# Patient Record
Sex: Female | Born: 1970 | Race: White | Hispanic: No | Smoking: Former smoker
Health system: Southern US, Community
[De-identification: ages and names within clinical notes are randomized; demographics above are authoritative.]

## PROBLEM LIST (undated history)

## (undated) DIAGNOSIS — F419 Anxiety disorder, unspecified: Secondary | ICD-10-CM

## (undated) DIAGNOSIS — F909 Attention-deficit hyperactivity disorder, unspecified type: Secondary | ICD-10-CM

## (undated) DIAGNOSIS — F410 Panic disorder [episodic paroxysmal anxiety] without agoraphobia: Secondary | ICD-10-CM

## (undated) HISTORY — PX: FOOT SURGERY: SHX648

## (undated) HISTORY — DX: Attention-deficit hyperactivity disorder, unspecified type: F90.9

## (undated) HISTORY — DX: Panic disorder (episodic paroxysmal anxiety): F41.0

## (undated) HISTORY — PX: FEMUR FRACTURE SURGERY: SHX633

## (undated) HISTORY — PX: HUMERUS FRACTURE SURGERY: SHX670

## (undated) HISTORY — PX: BREAST SURGERY: SHX581

---

## 1999-07-21 ENCOUNTER — Other Ambulatory Visit: Admission: RE | Admit: 1999-07-21 | Discharge: 1999-07-21 | Payer: Self-pay | Admitting: Obstetrics and Gynecology

## 2000-06-13 ENCOUNTER — Ambulatory Visit (HOSPITAL_BASED_OUTPATIENT_CLINIC_OR_DEPARTMENT_OTHER): Admission: RE | Admit: 2000-06-13 | Discharge: 2000-06-13 | Payer: Self-pay | Admitting: Otolaryngology

## 2001-03-21 ENCOUNTER — Ambulatory Visit (HOSPITAL_BASED_OUTPATIENT_CLINIC_OR_DEPARTMENT_OTHER): Admission: RE | Admit: 2001-03-21 | Discharge: 2001-03-22 | Payer: Self-pay | Admitting: Orthopedic Surgery

## 2001-04-02 ENCOUNTER — Encounter: Admission: RE | Admit: 2001-04-02 | Discharge: 2001-07-01 | Payer: Self-pay | Admitting: Orthopedic Surgery

## 2001-07-02 ENCOUNTER — Encounter: Admission: RE | Admit: 2001-07-02 | Discharge: 2001-08-05 | Payer: Self-pay | Admitting: Orthopedic Surgery

## 2002-01-07 ENCOUNTER — Encounter: Admission: RE | Admit: 2002-01-07 | Discharge: 2002-02-03 | Payer: Self-pay | Admitting: Pain Medicine

## 2002-02-20 ENCOUNTER — Ambulatory Visit (HOSPITAL_COMMUNITY): Admission: RE | Admit: 2002-02-20 | Discharge: 2002-02-20 | Payer: Self-pay | Admitting: Otolaryngology

## 2002-02-24 ENCOUNTER — Encounter: Admission: RE | Admit: 2002-02-24 | Discharge: 2002-04-15 | Payer: Self-pay | Admitting: Orthopedic Surgery

## 2002-04-26 ENCOUNTER — Emergency Department (HOSPITAL_COMMUNITY): Admission: EM | Admit: 2002-04-26 | Discharge: 2002-04-26 | Payer: Self-pay | Admitting: Emergency Medicine

## 2002-04-26 ENCOUNTER — Encounter: Payer: Self-pay | Admitting: Emergency Medicine

## 2003-02-08 ENCOUNTER — Emergency Department (HOSPITAL_COMMUNITY): Admission: EM | Admit: 2003-02-08 | Discharge: 2003-02-08 | Payer: Self-pay | Admitting: *Deleted

## 2004-04-06 ENCOUNTER — Emergency Department (HOSPITAL_COMMUNITY): Admission: EM | Admit: 2004-04-06 | Discharge: 2004-04-06 | Payer: Self-pay | Admitting: Family Medicine

## 2004-06-18 ENCOUNTER — Ambulatory Visit: Payer: Self-pay | Admitting: Psychiatry

## 2004-06-18 ENCOUNTER — Inpatient Hospital Stay (HOSPITAL_COMMUNITY): Admission: RE | Admit: 2004-06-18 | Discharge: 2004-06-22 | Payer: Self-pay | Admitting: Psychiatry

## 2004-08-18 ENCOUNTER — Emergency Department (HOSPITAL_COMMUNITY): Admission: EM | Admit: 2004-08-18 | Discharge: 2004-08-19 | Payer: Self-pay | Admitting: Emergency Medicine

## 2004-09-20 ENCOUNTER — Emergency Department (HOSPITAL_COMMUNITY): Admission: EM | Admit: 2004-09-20 | Discharge: 2004-09-20 | Payer: Self-pay | Admitting: Emergency Medicine

## 2004-10-26 ENCOUNTER — Emergency Department (HOSPITAL_COMMUNITY): Admission: EM | Admit: 2004-10-26 | Discharge: 2004-10-26 | Payer: Self-pay | Admitting: Emergency Medicine

## 2004-11-06 ENCOUNTER — Emergency Department (HOSPITAL_COMMUNITY): Admission: EM | Admit: 2004-11-06 | Discharge: 2004-11-06 | Payer: Self-pay | Admitting: Emergency Medicine

## 2004-11-08 ENCOUNTER — Emergency Department (HOSPITAL_COMMUNITY): Admission: EM | Admit: 2004-11-08 | Discharge: 2004-11-08 | Payer: Self-pay | Admitting: Family Medicine

## 2004-11-11 ENCOUNTER — Emergency Department (HOSPITAL_COMMUNITY): Admission: AD | Admit: 2004-11-11 | Discharge: 2004-11-11 | Payer: Self-pay | Admitting: Family Medicine

## 2004-11-16 ENCOUNTER — Emergency Department (HOSPITAL_COMMUNITY): Admission: EM | Admit: 2004-11-16 | Discharge: 2004-11-16 | Payer: Self-pay | Admitting: Emergency Medicine

## 2004-11-27 ENCOUNTER — Ambulatory Visit: Payer: Self-pay | Admitting: Internal Medicine

## 2004-11-30 ENCOUNTER — Ambulatory Visit: Payer: Self-pay | Admitting: Internal Medicine

## 2004-12-07 ENCOUNTER — Emergency Department (HOSPITAL_COMMUNITY): Admission: EM | Admit: 2004-12-07 | Discharge: 2004-12-07 | Payer: Self-pay | Admitting: Emergency Medicine

## 2005-03-03 ENCOUNTER — Emergency Department (HOSPITAL_COMMUNITY): Admission: EM | Admit: 2005-03-03 | Discharge: 2005-03-03 | Payer: Self-pay | Admitting: Emergency Medicine

## 2006-02-07 ENCOUNTER — Emergency Department (HOSPITAL_COMMUNITY): Admission: EM | Admit: 2006-02-07 | Discharge: 2006-02-07 | Payer: Self-pay | Admitting: Emergency Medicine

## 2006-02-18 ENCOUNTER — Emergency Department (HOSPITAL_COMMUNITY): Admission: EM | Admit: 2006-02-18 | Discharge: 2006-02-18 | Payer: Self-pay | Admitting: Emergency Medicine

## 2006-02-27 ENCOUNTER — Emergency Department (HOSPITAL_COMMUNITY): Admission: EM | Admit: 2006-02-27 | Discharge: 2006-02-27 | Payer: Self-pay | Admitting: Emergency Medicine

## 2007-06-08 ENCOUNTER — Emergency Department (HOSPITAL_COMMUNITY): Admission: EM | Admit: 2007-06-08 | Discharge: 2007-06-08 | Payer: Self-pay | Admitting: Emergency Medicine

## 2007-09-28 ENCOUNTER — Emergency Department (HOSPITAL_BASED_OUTPATIENT_CLINIC_OR_DEPARTMENT_OTHER): Admission: EM | Admit: 2007-09-28 | Discharge: 2007-09-28 | Payer: Self-pay | Admitting: Emergency Medicine

## 2007-10-15 ENCOUNTER — Inpatient Hospital Stay (HOSPITAL_COMMUNITY): Admission: EM | Admit: 2007-10-15 | Discharge: 2007-11-18 | Payer: Self-pay | Admitting: *Deleted

## 2007-11-18 ENCOUNTER — Encounter (INDEPENDENT_AMBULATORY_CARE_PROVIDER_SITE_OTHER): Payer: Self-pay | Admitting: Orthopaedic Surgery

## 2007-11-18 ENCOUNTER — Ambulatory Visit: Payer: Self-pay | Admitting: Surgery

## 2007-12-12 ENCOUNTER — Ambulatory Visit (HOSPITAL_COMMUNITY): Admission: RE | Admit: 2007-12-12 | Discharge: 2007-12-12 | Payer: Self-pay | Admitting: Internal Medicine

## 2007-12-17 ENCOUNTER — Ambulatory Visit (HOSPITAL_COMMUNITY): Admission: RE | Admit: 2007-12-17 | Discharge: 2007-12-18 | Payer: Self-pay | Admitting: Orthopaedic Surgery

## 2008-01-15 ENCOUNTER — Encounter: Admission: RE | Admit: 2008-01-15 | Discharge: 2008-01-15 | Payer: Self-pay | Admitting: Orthopaedic Surgery

## 2008-04-06 ENCOUNTER — Inpatient Hospital Stay (HOSPITAL_COMMUNITY): Admission: AD | Admit: 2008-04-06 | Discharge: 2008-04-07 | Payer: Self-pay

## 2008-04-16 DIAGNOSIS — L02419 Cutaneous abscess of limb, unspecified: Secondary | ICD-10-CM | POA: Insufficient documentation

## 2008-04-16 DIAGNOSIS — L03119 Cellulitis of unspecified part of limb: Secondary | ICD-10-CM

## 2008-04-21 ENCOUNTER — Encounter: Admission: RE | Admit: 2008-04-21 | Discharge: 2008-05-13 | Payer: Self-pay | Admitting: Family Medicine

## 2008-04-28 ENCOUNTER — Encounter: Admission: RE | Admit: 2008-04-28 | Discharge: 2008-04-28 | Payer: Self-pay | Admitting: Geriatric Medicine

## 2008-04-28 ENCOUNTER — Encounter: Payer: Self-pay | Admitting: Infectious Disease

## 2008-05-03 ENCOUNTER — Encounter: Payer: Self-pay | Admitting: Infectious Disease

## 2008-05-08 ENCOUNTER — Inpatient Hospital Stay (HOSPITAL_COMMUNITY): Admission: EM | Admit: 2008-05-08 | Discharge: 2008-05-18 | Payer: Self-pay | Admitting: Emergency Medicine

## 2008-05-10 ENCOUNTER — Ambulatory Visit: Payer: Self-pay | Admitting: Infectious Diseases

## 2008-05-12 ENCOUNTER — Encounter (INDEPENDENT_AMBULATORY_CARE_PROVIDER_SITE_OTHER): Payer: Self-pay | Admitting: Orthopaedic Surgery

## 2008-05-28 ENCOUNTER — Encounter: Payer: Self-pay | Admitting: Infectious Disease

## 2008-05-31 ENCOUNTER — Ambulatory Visit: Payer: Self-pay | Admitting: Infectious Disease

## 2008-05-31 DIAGNOSIS — F429 Obsessive-compulsive disorder, unspecified: Secondary | ICD-10-CM | POA: Insufficient documentation

## 2008-05-31 DIAGNOSIS — M86679 Other chronic osteomyelitis, unspecified ankle and foot: Secondary | ICD-10-CM | POA: Insufficient documentation

## 2008-05-31 DIAGNOSIS — F341 Dysthymic disorder: Secondary | ICD-10-CM | POA: Insufficient documentation

## 2008-05-31 DIAGNOSIS — F1910 Other psychoactive substance abuse, uncomplicated: Secondary | ICD-10-CM | POA: Insufficient documentation

## 2008-06-01 LAB — CONVERTED CEMR LAB
Albumin: 4.6 g/dL (ref 3.5–5.2)
Alkaline Phosphatase: 140 units/L — ABNORMAL HIGH (ref 39–117)
BUN: 16 mg/dL (ref 6–23)
Basophils Relative: 1 % (ref 0–1)
Calcium: 9.8 mg/dL (ref 8.4–10.5)
Eosinophils Absolute: 0.3 10*3/uL (ref 0.0–0.7)
Eosinophils Relative: 3 % (ref 0–5)
Glucose, Bld: 89 mg/dL (ref 70–99)
HCT: 32.5 % — ABNORMAL LOW (ref 36.0–46.0)
Lymphs Abs: 3.2 10*3/uL (ref 0.7–4.0)
MCHC: 31.7 g/dL (ref 30.0–36.0)
MCV: 89.3 fL (ref 78.0–100.0)
Platelets: 444 10*3/uL — ABNORMAL HIGH (ref 150–400)
Potassium: 4.8 meq/L (ref 3.5–5.3)
RDW: 14.7 % (ref 11.5–15.5)
WBC: 10 10*3/uL (ref 4.0–10.5)

## 2008-06-25 ENCOUNTER — Telehealth: Payer: Self-pay | Admitting: Infectious Disease

## 2008-06-29 ENCOUNTER — Telehealth: Payer: Self-pay | Admitting: Infectious Disease

## 2008-06-29 ENCOUNTER — Encounter (INDEPENDENT_AMBULATORY_CARE_PROVIDER_SITE_OTHER): Payer: Self-pay | Admitting: Internal Medicine

## 2008-07-01 ENCOUNTER — Telehealth: Payer: Self-pay | Admitting: Infectious Disease

## 2008-07-05 ENCOUNTER — Ambulatory Visit (HOSPITAL_COMMUNITY): Admission: RE | Admit: 2008-07-05 | Discharge: 2008-07-05 | Payer: Self-pay | Admitting: Infectious Disease

## 2008-07-05 ENCOUNTER — Ambulatory Visit: Payer: Self-pay | Admitting: Infectious Disease

## 2008-07-05 ENCOUNTER — Encounter (INDEPENDENT_AMBULATORY_CARE_PROVIDER_SITE_OTHER): Payer: Self-pay | Admitting: *Deleted

## 2008-07-05 DIAGNOSIS — R0789 Other chest pain: Secondary | ICD-10-CM | POA: Insufficient documentation

## 2008-07-05 LAB — CONVERTED CEMR LAB
CRP: 0.4 mg/dL (ref <0.6)
GFR calc Af Amer: >60 mL/min (ref >60)
GFR calc non Af Amer: >60 mL/min (ref >60)
Glucose, Bld: 105 mg/dL — ABNORMAL HIGH (ref 70–99)
Hemoglobin: 11.7 g/dL — ABNORMAL LOW (ref 12.0–15.0)
Lymphocytes Relative: 31 % (ref 12–46)
Monocytes Absolute: 0.5 10*3/uL (ref 0.1–1.0)
Monocytes Relative: 7 % (ref 3–12)
Neutro Abs: 4.1 10*3/uL (ref 1.7–7.7)
Potassium: 4.3 meq/L (ref 3.5–5.3)
RBC: 4.38 M/uL (ref 3.87–5.11)
Sed Rate: 31 mm/hr — ABNORMAL HIGH (ref 0–22)
Sodium: 135 meq/L (ref 135–145)

## 2008-07-07 ENCOUNTER — Ambulatory Visit: Payer: Self-pay | Admitting: Vascular Surgery

## 2008-07-07 ENCOUNTER — Encounter: Payer: Self-pay | Admitting: Infectious Disease

## 2008-07-07 ENCOUNTER — Ambulatory Visit (HOSPITAL_COMMUNITY): Admission: RE | Admit: 2008-07-07 | Discharge: 2008-07-07 | Payer: Self-pay | Admitting: Infectious Disease

## 2008-07-15 ENCOUNTER — Ambulatory Visit: Payer: Self-pay | Admitting: Infectious Disease

## 2008-07-15 DIAGNOSIS — R42 Dizziness and giddiness: Secondary | ICD-10-CM | POA: Insufficient documentation

## 2008-07-15 DIAGNOSIS — Z9189 Other specified personal risk factors, not elsewhere classified: Secondary | ICD-10-CM | POA: Insufficient documentation

## 2008-07-15 DIAGNOSIS — R0602 Shortness of breath: Secondary | ICD-10-CM | POA: Insufficient documentation

## 2008-07-15 LAB — CONVERTED CEMR LAB
ALT: 30 units/L (ref 0–35)
AST: 35 units/L (ref 0–37)
Albumin: 4.2 g/dL (ref 3.5–5.2)
Basophils Relative: 1 % (ref 0–1)
Benzodiazepines.: POSITIVE — AB
Calcium: 9.8 mg/dL (ref 8.4–10.5)
Chloride: 97 meq/L (ref 96–112)
Cocaine Metabolites: NEGATIVE
Creatinine,U: 168.7 mg/dL
Eosinophils Absolute: 0.4 10*3/uL (ref 0.0–0.7)
Eosinophils Relative: 6 % — ABNORMAL HIGH (ref 0–5)
Leukocytes, UA: NEGATIVE
MCHC: 33.6 g/dL (ref 30.0–36.0)
MCV: 80.4 fL (ref 78.0–100.0)
Monocytes Relative: 6 % (ref 3–12)
Neutrophils Relative %: 46 % (ref 43–77)
Nitrite: NEGATIVE
Phencyclidine (PCP): NEGATIVE
Potassium: 4.4 meq/L (ref 3.5–5.3)
Pro B Natriuretic peptide (BNP): <30 pg/mL (ref 0.0–100.0)
Propoxyphene: NEGATIVE
RBC: 4.45 M/uL (ref 3.87–5.11)
Specific Gravity, Urine: 1.022 (ref 1.005–1.030)
pH: 8 (ref 5.0–8.0)

## 2008-07-27 ENCOUNTER — Encounter: Payer: Self-pay | Admitting: Infectious Disease

## 2008-08-11 ENCOUNTER — Encounter (INDEPENDENT_AMBULATORY_CARE_PROVIDER_SITE_OTHER): Payer: Self-pay | Admitting: Orthopaedic Surgery

## 2008-08-11 ENCOUNTER — Ambulatory Visit (HOSPITAL_COMMUNITY): Admission: RE | Admit: 2008-08-11 | Discharge: 2008-08-11 | Payer: Self-pay | Admitting: Orthopaedic Surgery

## 2008-08-16 ENCOUNTER — Encounter (INDEPENDENT_AMBULATORY_CARE_PROVIDER_SITE_OTHER): Payer: Self-pay | Admitting: Orthopaedic Surgery

## 2008-09-24 ENCOUNTER — Inpatient Hospital Stay (HOSPITAL_COMMUNITY): Admission: RE | Admit: 2008-09-24 | Discharge: 2008-09-28 | Payer: Self-pay | Admitting: Orthopedic Surgery

## 2008-10-11 ENCOUNTER — Encounter: Payer: Self-pay | Admitting: Infectious Disease

## 2008-10-21 ENCOUNTER — Ambulatory Visit: Payer: Self-pay | Admitting: Obstetrics and Gynecology

## 2008-10-22 ENCOUNTER — Encounter: Payer: Self-pay | Admitting: Family

## 2008-10-22 LAB — CONVERTED CEMR LAB
Estradiol: 110.3 pg/mL
FSH: 2.1 milliintl units/mL
LH: 1.9 milliintl units/mL
TSH: 0.819 microintl units/mL (ref 0.350–4.500)

## 2008-10-26 ENCOUNTER — Ambulatory Visit (HOSPITAL_COMMUNITY): Admission: RE | Admit: 2008-10-26 | Discharge: 2008-10-26 | Payer: Self-pay | Admitting: Obstetrics & Gynecology

## 2008-10-28 ENCOUNTER — Encounter
Admission: RE | Admit: 2008-10-28 | Discharge: 2008-10-28 | Payer: Self-pay | Admitting: Physical Medicine & Rehabilitation

## 2008-12-09 ENCOUNTER — Emergency Department (HOSPITAL_COMMUNITY): Admission: EM | Admit: 2008-12-09 | Discharge: 2008-12-10 | Payer: Self-pay | Admitting: Emergency Medicine

## 2009-05-31 ENCOUNTER — Emergency Department (HOSPITAL_COMMUNITY): Admission: EM | Admit: 2009-05-31 | Discharge: 2009-05-31 | Payer: Self-pay | Admitting: Emergency Medicine

## 2009-12-06 ENCOUNTER — Ambulatory Visit: Payer: Self-pay | Admitting: Psychology

## 2010-03-17 ENCOUNTER — Encounter: Payer: Self-pay | Admitting: *Deleted

## 2010-04-01 ENCOUNTER — Encounter: Payer: Self-pay | Admitting: Geriatric Medicine

## 2010-04-04 ENCOUNTER — Encounter: Admit: 2010-04-04 | Payer: Self-pay | Admitting: Family Medicine

## 2010-04-13 NOTE — Miscellaneous (Signed)
Summary: narcotics directives  Clinical Lists Changes       Patient's name and MRN given to ED on 01/27/2009 for designation of "Pain Care Plan" based on physician order noted in Advanced Directive and registration sections of chart. Dorie Rank RN  March 17, 2010 7:40 AM

## 2010-04-20 ENCOUNTER — Emergency Department (HOSPITAL_COMMUNITY): Payer: No Typology Code available for payment source

## 2010-04-20 ENCOUNTER — Emergency Department (HOSPITAL_COMMUNITY)
Admission: EM | Admit: 2010-04-20 | Discharge: 2010-04-21 | Disposition: A | Discharge status: Home / Self Care | Payer: No Typology Code available for payment source | Attending: Emergency Medicine | Admitting: Emergency Medicine

## 2010-04-20 DIAGNOSIS — H538 Other visual disturbances: Secondary | ICD-10-CM | POA: Insufficient documentation

## 2010-04-20 DIAGNOSIS — M542 Cervicalgia: Secondary | ICD-10-CM | POA: Insufficient documentation

## 2010-04-20 DIAGNOSIS — R51 Headache: Secondary | ICD-10-CM | POA: Insufficient documentation

## 2010-04-20 DIAGNOSIS — S0990XA Unspecified injury of head, initial encounter: Secondary | ICD-10-CM | POA: Insufficient documentation

## 2010-04-20 DIAGNOSIS — H5710 Ocular pain, unspecified eye: Secondary | ICD-10-CM | POA: Insufficient documentation

## 2010-04-20 DIAGNOSIS — M25579 Pain in unspecified ankle and joints of unspecified foot: Secondary | ICD-10-CM | POA: Insufficient documentation

## 2010-06-05 LAB — URINALYSIS, ROUTINE W REFLEX MICROSCOPIC
Bilirubin Urine: NEGATIVE
Hgb urine dipstick: NEGATIVE
Ketones, ur: NEGATIVE mg/dL
Nitrite: NEGATIVE
Urobilinogen, UA: 0.2 mg/dL (ref 0.0–1.0)

## 2010-06-05 LAB — COMPREHENSIVE METABOLIC PANEL
ALT: 26 U/L (ref 0–35)
CO2: 30 mEq/L (ref 19–32)
Calcium: 10.2 mg/dL (ref 8.4–10.5)
Creatinine, Ser: 0.8 mg/dL (ref 0.4–1.2)
GFR calc non Af Amer: >60 mL/min (ref >60)
Glucose, Bld: 86 mg/dL (ref 70–99)
Sodium: 138 mEq/L (ref 135–145)
Total Protein: 8 g/dL (ref 6.0–8.3)

## 2010-06-05 LAB — RAPID URINE DRUG SCREEN, HOSP PERFORMED
Amphetamines: NOT DETECTED
Barbiturates: NOT DETECTED
Benzodiazepines: POSITIVE — AB
Opiates: NOT DETECTED

## 2010-06-05 LAB — DIFFERENTIAL
Lymphocytes Relative: 33 % (ref 12–46)
Lymphs Abs: 2.5 10*3/uL (ref 0.7–4.0)
Monocytes Relative: 7 % (ref 3–12)
Neutro Abs: 4.4 10*3/uL (ref 1.7–7.7)
Neutrophils Relative %: 57 % (ref 43–77)

## 2010-06-05 LAB — LIPASE, BLOOD: Lipase: 27 U/L (ref 11–59)

## 2010-06-05 LAB — CBC
Hemoglobin: 13.9 g/dL (ref 12.0–15.0)
MCHC: 32.8 g/dL (ref 30.0–36.0)
MCV: 85.4 fL (ref 78.0–100.0)
RBC: 4.94 MIL/uL (ref 3.87–5.11)
RDW: 15.5 % (ref 11.5–15.5)

## 2010-06-05 LAB — URINE MICROSCOPIC-ADD ON

## 2010-06-18 LAB — PROTIME-INR
INR: 1.1 (ref 0.00–1.49)
INR: 1.7 — ABNORMAL HIGH (ref 0.00–1.49)
INR: 2.7 — ABNORMAL HIGH (ref 0.00–1.49)
INR: 1.1 (ref 0.00–1.49)
Prothrombin Time: 14.3 seconds (ref 11.6–15.2)
Prothrombin Time: 20.5 seconds — ABNORMAL HIGH (ref 11.6–15.2)
Prothrombin Time: 29.6 seconds — ABNORMAL HIGH (ref 11.6–15.2)
Prothrombin Time: 14.1 seconds (ref 11.6–15.2)

## 2010-06-18 LAB — COMPREHENSIVE METABOLIC PANEL
Alkaline Phosphatase: 98 U/L (ref 39–117)
BUN: 5 mg/dL — ABNORMAL LOW (ref 6–23)
Creatinine, Ser: 0.81 mg/dL (ref 0.4–1.2)
Glucose, Bld: 102 mg/dL — ABNORMAL HIGH (ref 70–99)
Potassium: 4.9 mEq/L (ref 3.5–5.1)
Total Protein: 7.4 g/dL (ref 6.0–8.3)

## 2010-06-18 LAB — CBC
HCT: 40.6 % (ref 36.0–46.0)
Hemoglobin: 13.6 g/dL (ref 12.0–15.0)
MCHC: 33.6 g/dL (ref 30.0–36.0)
MCV: 81.1 fL (ref 78.0–100.0)
Platelets: 321 10*3/uL (ref 150–400)
RDW: 18.9 % — ABNORMAL HIGH (ref 11.5–15.5)

## 2010-06-18 LAB — APTT: aPTT: 31 seconds (ref 24–37)

## 2010-06-18 LAB — BILIRUBIN, DIRECT: Bilirubin, Direct: <0.1 mg/dL (ref 0.0–0.3)

## 2010-06-19 LAB — ANAEROBIC CULTURE: Gram Stain: NONE SEEN

## 2010-06-19 LAB — TISSUE CULTURE

## 2010-06-19 LAB — WOUND CULTURE: Culture: NO GROWTH

## 2010-06-20 LAB — BASIC METABOLIC PANEL
BUN: 6 mg/dL (ref 6–23)
Chloride: 101 mEq/L (ref 96–112)
Creatinine, Ser: 0.81 mg/dL (ref 0.4–1.2)
GFR calc non Af Amer: >60 mL/min (ref >60)
Glucose, Bld: 100 mg/dL — ABNORMAL HIGH (ref 70–99)
Potassium: 4.9 mEq/L (ref 3.5–5.1)

## 2010-06-20 LAB — DIFFERENTIAL
Basophils Absolute: 0 10*3/uL (ref 0.0–0.1)
Eosinophils Absolute: 0.3 10*3/uL (ref 0.0–0.7)
Eosinophils Relative: 5 % (ref 0–5)

## 2010-06-20 LAB — CBC
HCT: 34.8 % — ABNORMAL LOW (ref 36.0–46.0)
MCV: 80.2 fL (ref 78.0–100.0)
Platelets: 258 10*3/uL (ref 150–400)
RDW: 16.1 % — ABNORMAL HIGH (ref 11.5–15.5)

## 2010-06-22 LAB — BASIC METABOLIC PANEL
BUN: 9 mg/dL (ref 6–23)
CO2: 30 mEq/L (ref 19–32)
Calcium: 8.9 mg/dL (ref 8.4–10.5)
Creatinine, Ser: 0.68 mg/dL (ref 0.4–1.2)
GFR calc Af Amer: >60 mL/min (ref >60)
Glucose, Bld: 116 mg/dL — ABNORMAL HIGH (ref 70–99)

## 2010-06-22 LAB — TISSUE CULTURE

## 2010-06-22 LAB — CBC
MCHC: 34.1 g/dL (ref 30.0–36.0)
Platelets: 228 10*3/uL (ref 150–400)
Platelets: 241 10*3/uL (ref 150–400)
RBC: 2.67 MIL/uL — ABNORMAL LOW (ref 3.87–5.11)
RDW: 16 % — ABNORMAL HIGH (ref 11.5–15.5)
WBC: 4.9 10*3/uL (ref 4.0–10.5)

## 2010-06-22 LAB — VITAMIN D 1,25 DIHYDROXY
Vitamin D 1, 25 (OH)2 Total: 14 pg/mL — ABNORMAL LOW (ref 18–72)
Vitamin D3 1, 25 (OH)2: 14 pg/mL

## 2010-06-22 LAB — ANAEROBIC CULTURE

## 2010-06-22 LAB — DIFFERENTIAL
Basophils Absolute: 0 10*3/uL (ref 0.0–0.1)
Eosinophils Relative: 8 % — ABNORMAL HIGH (ref 0–5)
Lymphocytes Relative: 38 % (ref 12–46)
Neutro Abs: 2.1 10*3/uL (ref 1.7–7.7)
Neutrophils Relative %: 42 % — ABNORMAL LOW (ref 43–77)

## 2010-06-22 LAB — C-REACTIVE PROTEIN: CRP: 1.4 mg/dL — ABNORMAL HIGH (ref <0.6)

## 2010-06-22 LAB — HEMOGLOBIN AND HEMATOCRIT, BLOOD
HCT: 22 % — ABNORMAL LOW (ref 36.0–46.0)
Hemoglobin: 7.5 g/dL — CL (ref 12.0–15.0)

## 2010-06-22 LAB — SEDIMENTATION RATE: Sed Rate: 30 mm/hr — ABNORMAL HIGH (ref 0–22)

## 2010-06-26 LAB — URINALYSIS, ROUTINE W REFLEX MICROSCOPIC
Bilirubin Urine: NEGATIVE
Hgb urine dipstick: NEGATIVE
Protein, ur: NEGATIVE mg/dL
Urobilinogen, UA: 0.2 mg/dL (ref 0.0–1.0)

## 2010-06-26 LAB — DIFFERENTIAL
Eosinophils Absolute: 0.3 10*3/uL (ref 0.0–0.7)
Eosinophils Relative: 4 % (ref 0–5)
Lymphocytes Relative: 28 % (ref 12–46)
Lymphs Abs: 2 10*3/uL (ref 0.7–4.0)
Monocytes Relative: 9 % (ref 3–12)

## 2010-06-26 LAB — CBC
HCT: 30.7 % — ABNORMAL LOW (ref 36.0–46.0)
Hemoglobin: 10.4 g/dL — ABNORMAL LOW (ref 12.0–15.0)
MCHC: 33.5 g/dL (ref 30.0–36.0)
Platelets: 352 10*3/uL (ref 150–400)
RBC: 3.6 MIL/uL — ABNORMAL LOW (ref 3.87–5.11)
RDW: 19.4 % — ABNORMAL HIGH (ref 11.5–15.5)
WBC: 6.3 10*3/uL (ref 4.0–10.5)

## 2010-06-26 LAB — COMPREHENSIVE METABOLIC PANEL
ALT: 13 U/L (ref 0–35)
AST: 19 U/L (ref 0–37)
Alkaline Phosphatase: 107 U/L (ref 39–117)
BUN: 11 mg/dL (ref 6–23)
CO2: 29 mEq/L (ref 19–32)
Calcium: 9.9 mg/dL (ref 8.4–10.5)
Chloride: 102 mEq/L (ref 96–112)
Creatinine, Ser: 0.97 mg/dL (ref 0.4–1.2)
GFR calc Af Amer: >60 mL/min (ref >60)
Glucose, Bld: 94 mg/dL (ref 70–99)
Potassium: 4.6 mEq/L (ref 3.5–5.1)
Sodium: 138 mEq/L (ref 135–145)
Total Bilirubin: 0.6 mg/dL (ref 0.3–1.2)
Total Protein: 7.3 g/dL (ref 6.0–8.3)

## 2010-06-26 LAB — C-REACTIVE PROTEIN: CRP: 1 mg/dL — ABNORMAL HIGH (ref <0.6)

## 2010-06-26 LAB — PROTIME-INR: INR: 1.1 (ref 0.00–1.49)

## 2010-06-26 LAB — URINE CULTURE

## 2010-06-27 LAB — BASIC METABOLIC PANEL
CO2: 30 mEq/L (ref 19–32)
Chloride: 101 mEq/L (ref 96–112)
Creatinine, Ser: 0.77 mg/dL (ref 0.4–1.2)
GFR calc Af Amer: >60 mL/min (ref >60)
Potassium: 4.4 mEq/L (ref 3.5–5.1)

## 2010-06-27 LAB — CBC
MCV: 88.8 fL (ref 78.0–100.0)
Platelets: 262 10*3/uL (ref 150–400)
RDW: 17.4 % — ABNORMAL HIGH (ref 11.5–15.5)
WBC: 8.1 10*3/uL (ref 4.0–10.5)

## 2010-06-27 LAB — DIFFERENTIAL
Basophils Absolute: 0 10*3/uL (ref 0.0–0.1)
Eosinophils Absolute: 0.4 10*3/uL (ref 0.0–0.7)
Lymphocytes Relative: 34 % (ref 12–46)
Lymphs Abs: 2.8 10*3/uL (ref 0.7–4.0)
Neutrophils Relative %: 52 % (ref 43–77)

## 2010-06-27 LAB — CULTURE, BLOOD (ROUTINE X 2)

## 2010-07-16 IMAGING — CR DG FEMUR 2V*L*
2 series · 2 of 2 positions shown · non-contrast
Comparison: 10/15/2007

CLINICAL DATA: Osteomyelitis

LEFT FEMUR - 2 VIEW

[view not recorded (1 of 2)]
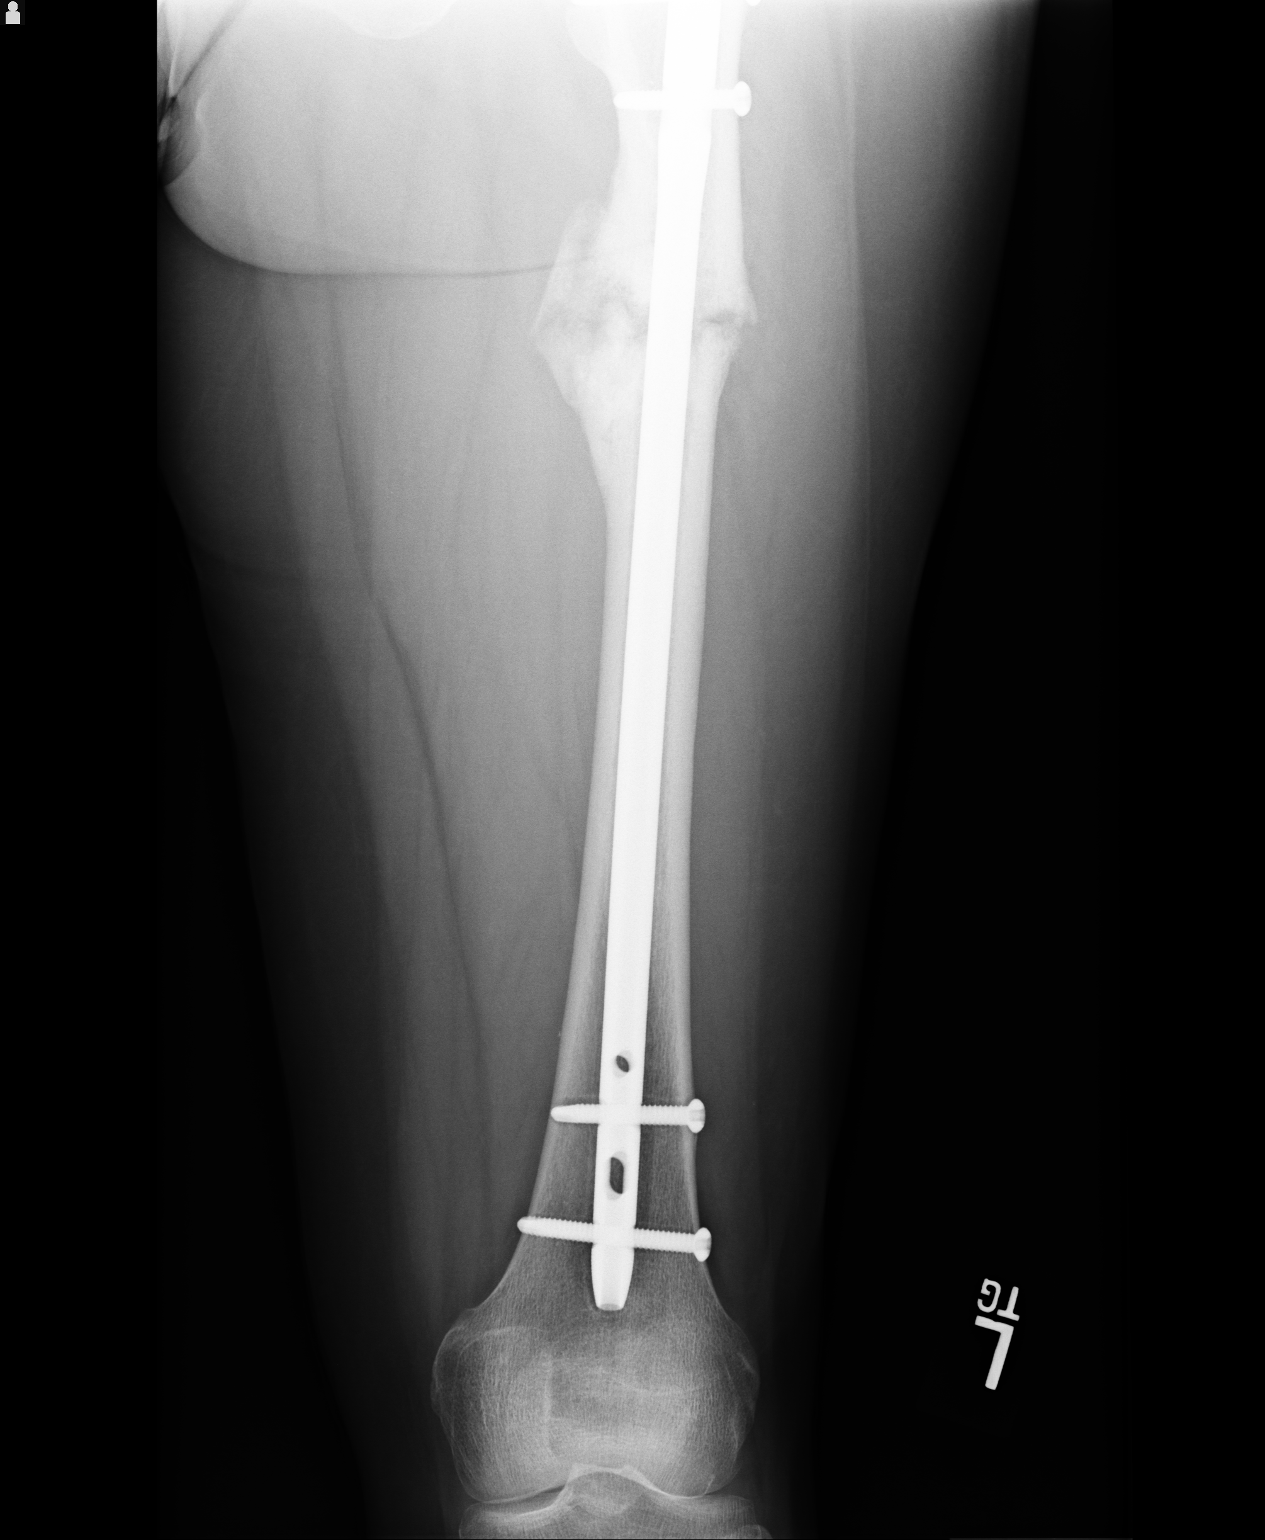

[view not recorded (2 of 2)]
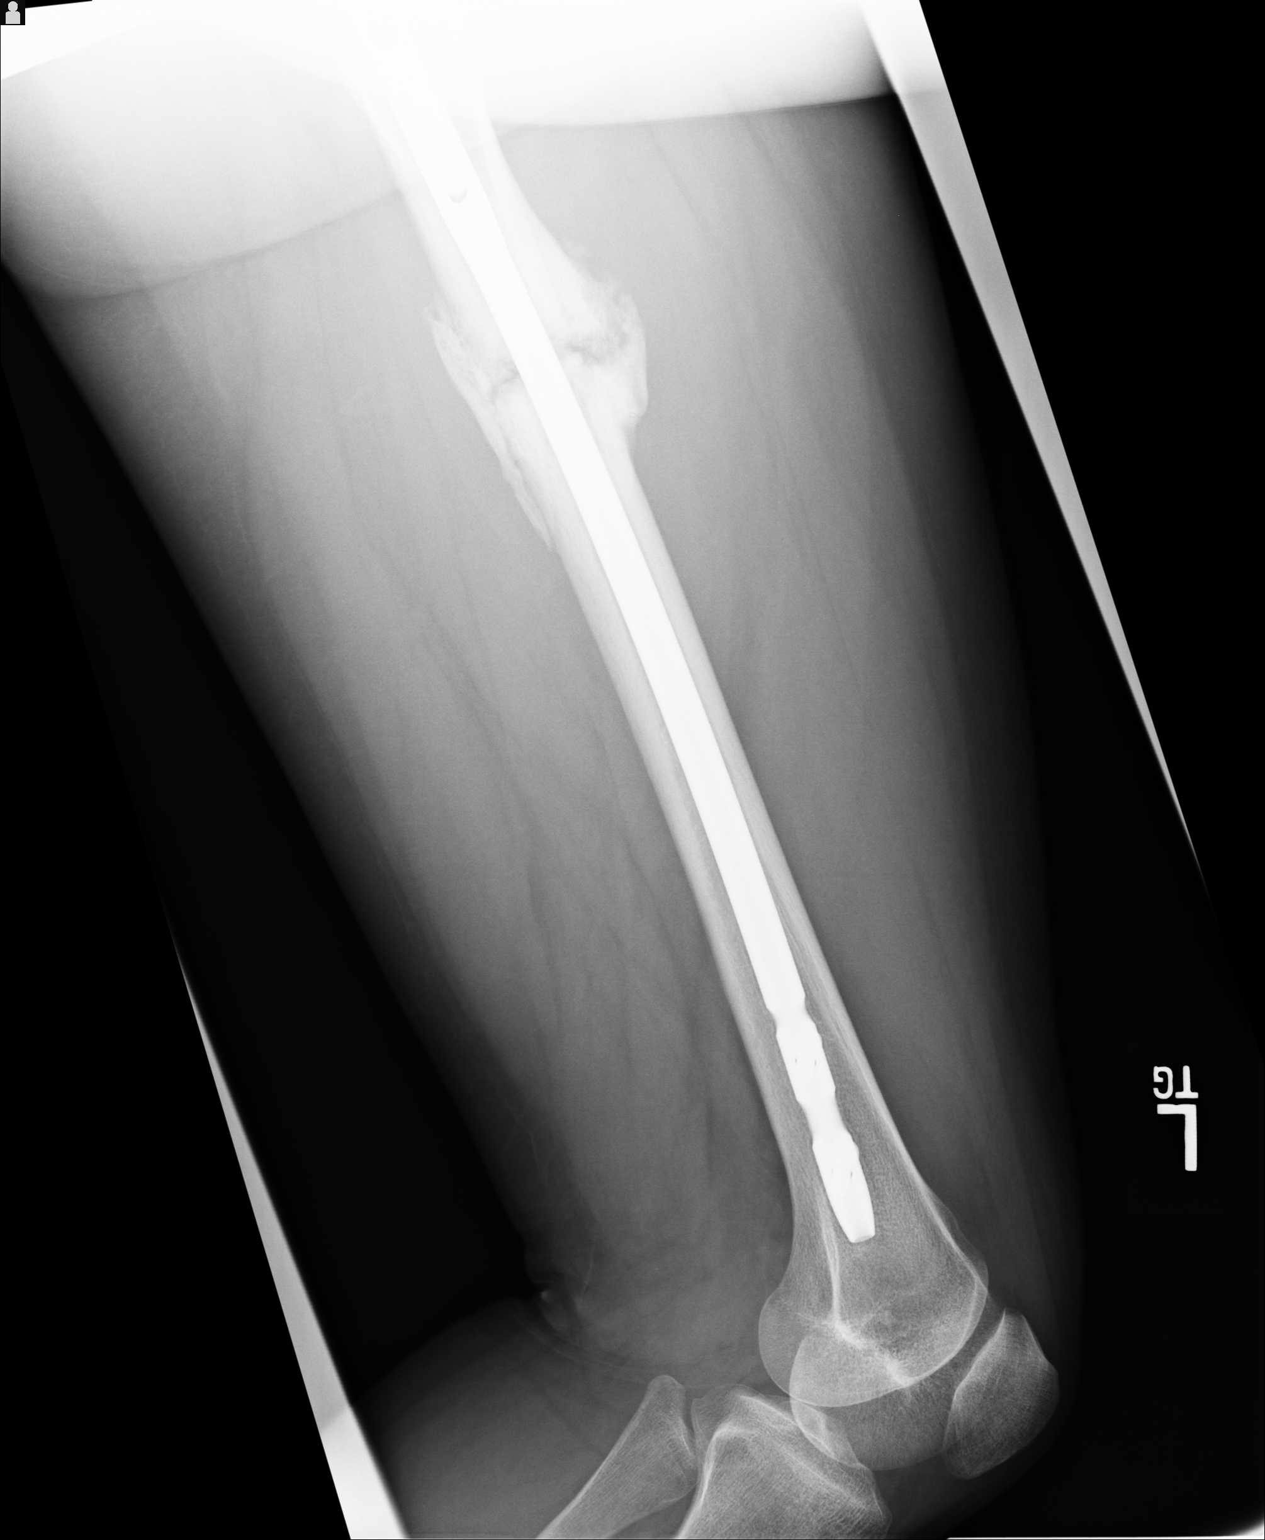

[2 of 2 positions shown; findings below may reference images not displayed]

FINDINGS: There is an intermedullary rod and to distal interlocking
screws transfixing a midshaft femur fracture.  The fracture line is
irregular and still clearly visible without definitive callus
bridging across the fracture site.  There is extensive sclerosis
about the fracture site.  No new fracture or breakage of the
hardware is evident.
IMPRESSION: The midshaft femur fracture has still 98 healed and there is
extensive callus and sclerosis about the fracture site.  This is in
comparison to operative films from October 2007.
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
## 2010-07-25 NOTE — Op Note (Signed)
NAMEWindy Canny              ACCOUNT NO.:  [id number]   MEDICAL RECORD NO.:  [id number]          PATIENT TYPE:  INP   LOCATION:  5001                         FACILITY:  [facility]   PHYSICIAN:  Mark C. Ophelia Charter, M.D.    DATE OF BIRTH:  [birth year]   DATE OF PROCEDURE:  DATE OF DISCHARGE:                               OPERATIVE REPORT   PREOPERATIVE DIAGNOSES:  Left femoral shaft delayed union/nonunion,  status post right ankle talus, Taylor neck and comminuted open calcaneal  fractures with probable osteomyelitis.   POSTOPERATIVE DIAGNOSES:  Left femoral shaft delayed union/nonunion,  status post right ankle talus, Taylor neck and comminuted open calcaneal  fractures with probable osteomyelitis.   PROCEDURE:  Removal of left intramedullary locking nail with reaming and  exchange nail with interlock.  Synthes exchanged from 10-12 mm nail with  proximal interlock.  Right ankle arthrotomy with synovial and Kris Hartmann needle bone biopsy.   SURGEON:  Mark C. Ophelia Charter, MD   ANESTHESIA:  GOT.   TOURNIQUET:  Right lower extremity less than 20 minutes.   PROCEDURE:  After induction of general anesthesia and orotracheal  intubation with the patient in the fracture table, well leg holder on  the right foot internally rotated and lateralized the trochanter.  On  the left standard prepping and draping was performed.  Careful padding  and positioning of the foot, just slight traction was applied.  DuraPrep  was used, large shower curtain, Betadine, Vi-Drape was applied and old  incisions had been outlined with the scalpel.  All scars were excised.  Subcutaneous tissue was sharply dissected and  trochanter was identified  proximally and a cerebellar retractor was placed.  Small amount of  drilling was performed and checking under C-arm until the rod was  identified.  Rod extractor was inserted.  Once it was securely inserted,  a small incision was made in the subtrochanteric region and the  interlocks removed.  Interlocks were difficult to catch with the star-  shaped screwdriver and the subtrochanteric incision had to be connected  to the proximal incision for full exposure to see the heads and get the  screws out.  Distal screw was also removed and one of the screws had to  be grasped with the clamp and backed out since it could be caught with a  screwdriver but would only stand without actually backing up out of the  cortical bone.  Once the interlocks were removed, the rod was easily  extracted.  Wound was irrigated.  There was no purulence noted and  sequential reaming up to 13 and then placement of 12 mm rod was placed.  Fluoroscopic pictures in AP and lateral was checked.  Femoral neck  pictures were taken and femoral neck was intact.  One single proximal  interlock was placed.  No interlocks were placed distally to allow for  compression.  There was relatively abundant amount of callus at the  fracture site but there is still a lucent line and she still was  symptomatic with increased pain over the last week to 2 weeks.   Layered closure  was performed with #1 Vicryl in the deep layer of the  iliotibial band and gluteus medius fascia.  2-0 Vicryl in subcutaneous  tissue and skin with staple closure.  Marcaine infiltration.  The skin  drain was placed in the hip.  Nail entry wound due to some bone oozing  coming up out of the rod.  Postop dressings were applied.  Well leg  holder was taken down and a flat leg tray was then applied and both legs  set down on this.  Left lower extremity was taken out of traction.  Proximal thigh tourniquet was applied on the right standard prepping to  the knee with DuraPrep, the usual extremity sheets drapes.  Sterile skin  marker.  Time-out checklist was repeated.  The patient had the check  list performed at the beginning of the case and had been getting  doxycycline and Cipro for a few months and did not had any drainage.  No   further antibiotics were given since bone biopsy is going to be  performed and she had already been on these antibiotics for several  months.  The leg was elevated and tourniquet inflated.  Small skin  marker was used and anterolateral incision was made blunt dissection  down to the capsule.  Capsule was opened and synovial tissue was  biopsied.  Synovial tissue was a little bit gray.  There was no purulent  material, did not appear to be necrotic and it was difficult to  determine if this represented long-standing ankle infection or whether  this was related to the AVN with Taylor head collapse fragmentation and  secondary arthritis with inflammatory changes.  Dome of the talus was  visualized and Tasia Catchings needle was used for a bone biopsy.  Biopsy was sent  for culture as well as the biopsy was sent for path and some synovial  tissue was sent for path.  Irrigation with saline and direct  visualization with magnification into the ankle joint was performed.  No  purulent areas were visualized.  There was some mobility of the talus  with fragmentation and after irrigation some 2-0 Vicryl was placed  single suture reapproximation followed by skin staple closure, Marcaine  infiltration and soft dressing.  Surgical checklist was completed prior  to closure of the wound and the procedure was as stated.  Instrument  count and needle count was correct.  The patient tolerated the procedure  well and was transferred to the recovery room.      Mark C. Ophelia Charter, M.D.  Electronically Signed     MCY/MEDQ  D:  05/12/2008  T:  05/13/2008  Job:  [id number]
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
## 2010-07-28 NOTE — Op Note (Signed)
[facility]  Patient:    Lindsey Curry Visit Number: [id number] MRN: [id number]          Service Type: DSU Location: [facility] Attending Physician:  Marlowe Shores Dictated by:   Artist Pais Mina Marble, M.D. Proc. 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  OPERATION:  IM rodding of above fracture using an Acumed Polaris humeral nail system, 220 mm humeral nail locked with a 3.5 x 32.5 mm cortical screw proximally.  SURGEON:  Artist Pais. Mina Marble, M.D.  ASSISTANT:  R.N.  ANESTHESIA:  General.  TOURNIQUET:  None.  COMPLICATIONS:  None.  DRAINS:  None.  DESCRIPTION OF PROCEDURE:  The patient was taken to the operating room.  After the induction of adequate general anesthesia she was placed in beach chair position, well padded, and the right upper extremity was prepped and draped in the usual sterile fashion.  Once this was done, a 4 cm incision was made just off the anterolateral edge of the acromion.  The incision was taken down through the skin and subcutaneous tissues until the deltoid muscle was identified.  This was split longitudinally, and this exposed the underlying rotator cuff.  The rotator cuff was also split in the mid axis of the humeral head, and a cortical ______ was used to make a window in the cortex of the humeral head just off the anterolateral edge of the acromion.  This was enlarged to about 11 mm.  A guide wire was then passed through the proximal fragment and, after multiple attempts, into the distal fragment. Intraoperative x-rays showed good reduction in both AP and lateral planes.  At this point in time the proximal fragment was reamed to 11 mm using the Acumed reamers from the set.   This was followed by flexible reamers to the distal fragment of 8.5 mm.  This was identified by passage of a 220 mm x standard diameter Acumed Polaris nail across the fracture site under direct vision. Once this was done, the proximal part was locked with a cortical locking screw using the appropriate guide.  After this was done, intraoperative x-rays revealed good placement of all hardware with astatic fit distally and, therefore, no distal locking screw was placed.  The proximal wound was thoroughly irrigated.  The rotator cuff was closed with 0-Vicryl, 2-0 Vicryl to close the deltoid, and the skin with a running 3-0 Prolene subcuticular stitch.  Steri-Strips, ______  , and compressive shoulder dressing were applied.  The patient tolerated the procedure well and went to the recovery room in stable fashion. Dictated by:   Artist Pais Mina Marble, M.D. Attending Physician:  Marlowe Shores DD:  03/21/01 TD:  03/21/01 Job: [id number]

## 2010-12-08 LAB — BASIC METABOLIC PANEL
BUN: 10
BUN: 2 — ABNORMAL LOW
CO2: 22
CO2: 24
Calcium: 8.1 — ABNORMAL LOW
Calcium: 8.9
Chloride: 110
Chloride: 111
Chloride: 97
Creatinine, Ser: 0.58
Creatinine, Ser: 0.61
Creatinine, Ser: 0.65
GFR calc Af Amer: >60
GFR calc Af Amer: >60
GFR calc Af Amer: >60
GFR calc non Af Amer: >60
Potassium: 3.7
Sodium: 135

## 2010-12-08 LAB — COMPREHENSIVE METABOLIC PANEL
ALT: 34
Albumin: 1.9 — ABNORMAL LOW
Alkaline Phosphatase: 54
BUN: 4 — ABNORMAL LOW
Chloride: 113 — ABNORMAL HIGH
Glucose, Bld: 117 — ABNORMAL HIGH
Potassium: 3.5
Sodium: 139
Total Bilirubin: 0.6
Total Protein: 4.3 — ABNORMAL LOW

## 2010-12-08 LAB — POCT I-STAT, CHEM 8
BUN: 18
Chloride: 112
HCT: 43
Potassium: 3.6
Sodium: 139

## 2010-12-08 LAB — CBC
Hemoglobin: 14.1
Hemoglobin: 9 — ABNORMAL LOW
MCHC: 34.3
MCHC: 34.5
MCHC: 35
MCV: 90.5
MCV: 90.9
Platelets: 279
Platelets: 694 — ABNORMAL HIGH
RBC: 2.86 — ABNORMAL LOW
RBC: 3.44 — ABNORMAL LOW
RBC: 4.5
RDW: 14.1
WBC: 12.5 — ABNORMAL HIGH
WBC: 18.9 — ABNORMAL HIGH
WBC: 6.7

## 2010-12-08 LAB — SAMPLE TO BLOOD BANK

## 2010-12-08 LAB — ETHANOL: Alcohol, Ethyl (B): 124 — ABNORMAL HIGH

## 2010-12-08 LAB — PROTIME-INR
INR: 1
Prothrombin Time: 13.4
Prothrombin Time: 15.4 — ABNORMAL HIGH

## 2010-12-11 LAB — ANAEROBIC CULTURE

## 2010-12-11 LAB — CBC
HCT: 33.8 — ABNORMAL LOW
Hemoglobin: 11 — ABNORMAL LOW
MCV: 82.1
Platelets: 268
RBC: 4.11
WBC: 6

## 2010-12-11 LAB — GRAM STAIN

## 2010-12-11 LAB — WOUND CULTURE

## 2010-12-13 LAB — PROTIME-INR
INR: 1.1
INR: 1.1
INR: 1.3
Prothrombin Time: 14.1
Prothrombin Time: 17 — ABNORMAL HIGH

## 2010-12-13 LAB — CBC
Hemoglobin: 9.6 — ABNORMAL LOW
MCV: 84.1
RBC: 3.38 — ABNORMAL LOW
WBC: 6.3

## 2010-12-13 LAB — BASIC METABOLIC PANEL
Chloride: 100
Creatinine, Ser: 0.53
GFR calc Af Amer: >60
Sodium: 138

## 2011-01-16 ENCOUNTER — Other Ambulatory Visit: Payer: Self-pay | Admitting: Family Medicine

## 2011-01-16 DIAGNOSIS — Z1231 Encounter for screening mammogram for malignant neoplasm of breast: Secondary | ICD-10-CM

## 2011-02-09 ENCOUNTER — Ambulatory Visit: Payer: No Typology Code available for payment source

## 2011-03-07 ENCOUNTER — Ambulatory Visit
Admission: RE | Admit: 2011-03-07 | Discharge: 2011-03-07 | Disposition: A | Discharge status: Home / Self Care | Payer: No Typology Code available for payment source | Source: Ambulatory Visit | Attending: Family Medicine | Admitting: Family Medicine

## 2011-03-07 DIAGNOSIS — Z1231 Encounter for screening mammogram for malignant neoplasm of breast: Secondary | ICD-10-CM

## 2011-03-23 ENCOUNTER — Encounter: Payer: Self-pay | Admitting: Obstetrics and Gynecology

## 2011-03-23 ENCOUNTER — Ambulatory Visit (INDEPENDENT_AMBULATORY_CARE_PROVIDER_SITE_OTHER): Payer: Medicaid Other | Admitting: Obstetrics and Gynecology

## 2011-03-23 VITALS — BP 103/70 | HR 86 | Temp 97.2°F | Ht 63.0 in | Wt 130.2 lb

## 2011-03-23 DIAGNOSIS — N939 Abnormal uterine and vaginal bleeding, unspecified: Secondary | ICD-10-CM

## 2011-03-23 DIAGNOSIS — N926 Irregular menstruation, unspecified: Secondary | ICD-10-CM

## 2011-03-23 NOTE — Progress Notes (Signed)
41 yo G2P2 with BMI 23 presents here as a referral for evaluation of abnormal uterine bleeding. Patient states she had a Paraguard IUD placed a few years ago which caused her to have excessive vaginal bleeding. Patient was seen by a physician in [place] who removed the IUD. An ultrasound was also ordered which demonstrated a thickened lining and small fibroids and patient was started on Provera. Patient reports that since starting the provera she has experienced very regular and normal menses q28 days lasting 5 days. Patient also had unprotected intercourse a few days ago and is concerned that she may be pregnant.  Past Medical History  Diagnosis Date  . Panic attack   . ADHD (attention deficit hyperactivity disorder)   . TIA (transient ischemic attack)    Past Surgical History  Procedure Date  . Foot surgery     x 11 surgeries- rods in place femur, tibia/fibia  . Cesarean section   . Breast surgery     breast implants   History reviewed. No pertinent family history.  History  Substance Use Topics  . Smoking status: Never Smoker   . Smokeless tobacco: Never Used  . Alcohol Use: No   PE:  Abd: S/NT/ND Pelvic: 8 week size anteverted fibroid uterus, no palpable masses or tenderness  A/P 41 yo with h/o abnormal uterine bleeding and thickened endometrium - Pelvic ultrasound was ordered to evaluate endometrial lining - Patient to return following ultrasound to review results and possible endometrial biopsy - endometrial biopsy delayed in view of recent intercourse - Patient desires to be initiated on birth control if possible at her next visit

## 2011-03-26 ENCOUNTER — Ambulatory Visit (HOSPITAL_COMMUNITY)
Admission: RE | Admit: 2011-03-26 | Discharge: 2011-03-26 | Disposition: A | Discharge status: Home / Self Care | Payer: Medicaid Other | Source: Ambulatory Visit | Attending: Obstetrics and Gynecology | Admitting: Obstetrics and Gynecology

## 2011-03-26 DIAGNOSIS — N926 Irregular menstruation, unspecified: Secondary | ICD-10-CM | POA: Insufficient documentation

## 2011-03-26 DIAGNOSIS — N939 Abnormal uterine and vaginal bleeding, unspecified: Secondary | ICD-10-CM

## 2011-04-05 ENCOUNTER — Other Ambulatory Visit: Payer: Self-pay | Admitting: Specialist

## 2011-04-05 DIAGNOSIS — M79606 Pain in leg, unspecified: Secondary | ICD-10-CM

## 2011-04-05 DIAGNOSIS — M549 Dorsalgia, unspecified: Secondary | ICD-10-CM

## 2011-04-09 ENCOUNTER — Ambulatory Visit
Admission: RE | Admit: 2011-04-09 | Discharge: 2011-04-09 | Disposition: A | Discharge status: Home / Self Care | Payer: Medicaid Other | Source: Ambulatory Visit | Attending: Specialist | Admitting: Specialist

## 2011-04-09 DIAGNOSIS — M79606 Pain in leg, unspecified: Secondary | ICD-10-CM

## 2011-04-09 DIAGNOSIS — M549 Dorsalgia, unspecified: Secondary | ICD-10-CM

## 2011-07-18 ENCOUNTER — Emergency Department (HOSPITAL_COMMUNITY): Payer: Self-pay

## 2011-07-18 ENCOUNTER — Emergency Department (HOSPITAL_COMMUNITY)
Admission: EM | Admit: 2011-07-18 | Discharge: 2011-07-19 | Disposition: A | Discharge status: Home / Self Care | Payer: Self-pay | Attending: Emergency Medicine | Admitting: Emergency Medicine

## 2011-07-18 DIAGNOSIS — R609 Edema, unspecified: Secondary | ICD-10-CM | POA: Insufficient documentation

## 2011-07-18 DIAGNOSIS — M79609 Pain in unspecified limb: Secondary | ICD-10-CM | POA: Insufficient documentation

## 2011-07-18 DIAGNOSIS — W208XXA Other cause of strike by thrown, projected or falling object, initial encounter: Secondary | ICD-10-CM | POA: Insufficient documentation

## 2011-07-18 DIAGNOSIS — M7989 Other specified soft tissue disorders: Secondary | ICD-10-CM | POA: Insufficient documentation

## 2011-07-18 DIAGNOSIS — M79671 Pain in right foot: Secondary | ICD-10-CM

## 2011-07-18 MED ORDER — OXYCODONE-ACETAMINOPHEN 5-325 MG PO TABS
1.0000 | ORAL_TABLET | Freq: Once | ORAL | Status: AC
  Administered 2011-07-18: 1 via ORAL
  Filled 2011-07-18: qty 1

## 2011-07-18 MED ORDER — OXYCODONE-ACETAMINOPHEN 5-325 MG PO TABS: 1.0000 | ORAL_TABLET | Freq: Four times a day (QID) | ORAL | Status: AC | PRN

## 2011-07-18 MED ORDER — NAPROXEN 500 MG PO TABS: 500.0000 mg | ORAL_TABLET | Freq: Two times a day (BID) | ORAL | Status: AC

## 2011-07-18 NOTE — ED Notes (Signed)
Ortho tech notified for ace wrap and crutches

## 2011-07-18 NOTE — ED Provider Notes (Signed)
History     CSN: [id number]  Arrival date & time 07/18/11  2134   First MD Initiated Contact with Patient 07/18/11 2255      Chief Complaint  Patient presents with  . Foot Injury    (Consider location/radiation/quality/duration/timing/severity/associated sxs/prior treatment) HPI Comments: Patient with history of right tibiotalar and talocalcaneal fusion -- presents with right foot injury. 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 Patient thinks the foot is broken. She is unable to bear weight on the foot. She complains of pain. No treatments prior to arrival. Movement makes the pain worse. Nothing makes the pain better. Patient denies ankle, knee, hip pain.  Patient is a 41 y.o. female presenting with foot injury. The history is provided by the patient.  Foot Injury  The incident occurred less than 1 hour ago. The injury mechanism was compression. The pain is present in the right foot. The quality of the pain is described as aching. The pain is moderate. The pain has been constant since onset. Associated symptoms include inability to bear weight and loss of motion. Pertinent negatives include no numbness, no muscle weakness, no loss of sensation and no tingling. The symptoms are aggravated by bearing weight. She has tried nothing for the symptoms.    Past Medical History  Diagnosis Date  . Panic attack   . ADHD (attention deficit hyperactivity disorder)   . TIA (transient ischemic attack)     Past Surgical History  Procedure Date  . Foot surgery     x 11 surgeries- rods in place femur, tibia/fibia  . Cesarean section   . Breast surgery     breast implants    No family history on file.  History  Substance Use Topics  . Smoking status: Never Smoker   . Smokeless tobacco: Never Used  . Alcohol Use: No    OB History    Grav Para Term Preterm Abortions TAB SAB Ect Mult Living                  Review of  Systems  Constitutional: Negative for activity change.  HENT: Negative for neck pain.   Musculoskeletal: Positive for arthralgias. Negative for back pain and joint swelling.  Skin: Positive for color change (Bruising). Negative for wound.  Neurological: Positive for weakness. Negative for tingling and numbness.    Allergies  Sulfur  Home Medications   Current Outpatient Rx  Name Route Sig Dispense Refill  . ALPRAZOLAM 1 MG PO TABS Oral Take 1 mg by mouth 4 (four) times daily.    . METHYLPHENIDATE HCL 20 MG PO TABS Oral Take 20 mg by mouth 2 (two) times daily.      BP 112/72  Pulse 98  Temp(Src) 98.3 F (36.8 C) (Oral)  Resp 18  SpO2 94%  LMP 06/18/2011  Physical Exam  Nursing note and vitals reviewed. Constitutional: She appears well-developed and well-nourished.  HENT:  Head: Normocephalic and atraumatic.  Eyes: Pupils are equal, round, and reactive to light.  Neck: Normal range of motion. Neck supple.  Cardiovascular: Normal rate, regular rhythm and intact distal pulses.  Exam reveals no decreased pulses.   Pulses:      Dorsalis pedis pulses are 2+ on the right side, and 2+ on the left side.       Posterior tibial pulses are 2+ on the right side, and 2+ on the left side.  Pulmonary/Chest: Effort normal.  Abdominal: Soft. There is no tenderness.  Musculoskeletal: She exhibits edema and tenderness.       Right hip: Normal.       Right knee: She exhibits normal range of motion.       Right ankle: She exhibits decreased range of motion (Due to past surgery.). She exhibits no deformity.       Right foot: She exhibits decreased range of motion (Patient has trouble wiggling toes), tenderness and swelling. She exhibits normal capillary refill, no deformity and no laceration.       Feet:  Neurological: She is alert. No sensory deficit.       Motor, sensation, and vascular distal to the injury is fully intact.   Skin: Skin is warm and dry.  Psychiatric: She has a normal mood  and affect.    ED Course  Procedures (including critical care time)  Labs Reviewed - No data to display Dg Foot Complete Right  07/18/2011  *RADIOLOGY REPORT*  Clinical Data: Tire fell on foot; twisting injury to the right foot.  Lateral right foot pain.  RIGHT FOOT COMPLETE - 3+ VIEW  Comparison: Right ankle radiographs performed 04/20/2010, and right foot radiographs performed 12/09/2008  Findings: As before, the patient is status post tibiotalar and talocalcaneal fusion, with an intramedullary rod extending through the tibia, talus and calcaneus.  Associated hardware appears grossly intact, with mild associated degenerative change.  There is no evidence of loosening of hardware; the osseous structures are grossly unchanged in appearance, aside from mild increased diffuse osteopenia of visualized osseous structures.  There is no evidence of acute fracture.  There is chronic resorption of the distal fibula.  Visualized joint spaces are otherwise preserved.  An os peroneum is noted.  No significant soft tissue abnormalities are characterized on radiograph.  IMPRESSION:  1.  No significant change from prior study; postsurgical changes are stable in appearance.  No definite evidence of loosening of hardware.  No evidence of acute fracture. 2.  Mild diffuse osteopenia of visualized osseous structures.  Original Report Authenticated By: Tonia Ghent, M.D.     1. Right foot pain     11:25 PM Patient seen and examined. X-ray ordered.   Vital signs reviewed and are as follows: Filed Vitals:   07/18/11 2203  BP: 112/72  Pulse: 98  Temp: 98.3 F (36.8 C)  Resp: 18   X-ray reviewed by myself. Neg per radiologist. Pt informed. Percocet ordered. ACE wrap and crutches by ortho tech. Pt sees Dr. Lajoyce Corners and states she will follow-up. She is relieved by x-ray findings.   Counseled on RICE protocol. NSAID rx.   Patient counseled on use of narcotic pain medications. Counseled not to combine these  medications with others containing tylenol. Urged not to drink alcohol, drive, or perform any other activities that requires focus while taking these medications. The patient verbalizes understanding and agrees with the plan.   MDM  Foot injury: x-ray neg, hardware intact. Conservative management indicated. No concern for compartment syndrome. Pt has ortho follow-up.         Renne Crigler, [place] 07/19/11 [phone]

## 2011-07-18 NOTE — Discharge Instructions (Signed)
Please read and follow all provided instructions.  Your diagnoses today include:  1. Right foot pain     Tests performed today include:  An x-ray of your foot - does NOT show any broken bones  Vital signs. See below for your results today.   Medications prescribed:   Percocet (oxycodone/acetaminophen) - narcotic pain medication  If you have been prescribed narcotic pain medication such as Vicodin, Percocet or Tramadol: DO NOT drive or perform any activities that require you to be awake and alert because this medicine can make you drowsy. BE VERY CAREFUL not to take multiple medicines containing Tylenol (also called acetaminophen). Doing so can lead to an overdose which can damage your liver and cause liver failure and possibly death.    Naproxen - anti-inflammatory pain medication  Do not exceed 500mg  naproxen every 12 hours, take with food  You have been prescribed an anti-inflammatory medication or NSAID. Take with food. Take smallest effective dose for the shortest duration needed for your pain. Stop taking if you experience stomach pain or vomiting.   Take any prescribed medications only as directed.  Home care instructions:   Follow any educational materials contained in this packet  Follow R.I.C.E. Protocol:  R - rest your injury   I  - use ice on injury without applying directly to skin  C - compress injury with bandage or splint  E - elevate the injury as much as possible  Follow-up instructions: Please follow-up with your primary care provider or the provided orthopedic (bone specialist) if you continue to have significant pain or trouble walking in 1 week. In this case you may have a severe sprain that requires further care.   If you do not have a primary care doctor -- see below for referral information.   Return instructions:   Please return if your toes are numb or tingling, appear gray or blue, or you have severe pain (also elevate leg and loosen splint  or wrap)  Please return to the Emergency Department if you experience worsening symptoms.   Please return if you have any other emergent concerns.  Additional Information:  Your vital signs today were: BP 112/72  Pulse 98  Temp(Src) 98.3 F (36.8 C) (Oral)  Resp 18  SpO2 94%  LMP 06/18/2011 If your blood pressure (BP) was elevated above 135/85 this visit, please have this repeated by your doctor within one month. -------------- Your caregiver has diagnosed you as suffering from an ankle sprain. Ankle sprain occurs when the ligaments that hold the ankle joint together are stretched or torn. It may take 4 to 6 weeks to heal.  For Activity: If prescribed crutches, use crutches with non-weight bearing for the first few days. Then, you may walk on your ankle as the pain allows, or as instructed. Start gradually with weight bearing on the affected ankle. Once you can walk pain free, then try jogging. When you can run forwards, then you can try moving side-to-side. If you cannot walk without crutches in one week, you need a re-check. -------------- No Primary Care Doctor Call [facility]  [phone] Other agencies that provide inexpensive medical care    [facility]  [phone]    [facility]  [phone]    [facility]  [phone]    [facility]  [phone]    [facility]  [phone]    [facility]  [phone] -------------- RESOURCE GUIDE:  Dental Problems  Patients with Medicaid: [facility]  Dentistry                     [employer] [phone] [address]                                            [phone] [address] [employer] [address] Phone:  [phone]                                                   Phone:  [phone]  If unable to pay or uninsured, contact:  [facility] or [facility]. to become qualified for the adult dental clinic.  Chronic Pain Problems Contact [facility]  [phone] Patients need  to be referred by their primary care doctor.  Insufficient Money for Medicine Contact [employer]:  call "211" or [facility] [phone].  Psychological Services [facility]  [phone] [facility]  [phone] [facility]   [phone] (emergency services [phone])  Substance Abuse Resources [facility]  [phone] [facility] [phone] [facility] [place] [phone] Floydene Flock [phone] Residential & Outpatient Substance Abuse Program  [phone]  Abuse/Neglect [facility] Child Abuse Hotline [phone] [facility] Child Abuse Hotline [phone] (After Hours)  Emergency Shelter [facility] [phone]  Maternity Homes [facility] [place] [facility] [phone] [place] [facility] [phone]  [facility] Resources  [facility] [place]     [employer]                          [facility]. [address] [place]                       [address]      [address]  Blondell Reveal Phone:  [phone]                                   Phone:  [phone]                 Phone:  [phone]  [facility] Phone:  [phone]  [facility] Child Abuse Hotline [phone] [phone] (After Hours)
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
## 2011-07-19 NOTE — ED Provider Notes (Signed)
Medical screening examination/treatment/procedure(s) were performed by non-physician practitioner and as supervising physician I was immediately available for consultation/collaboration.  Venita Seng R. Zephaniah Enyeart, MD 07/19/11 0730 

## 2014-07-02 ENCOUNTER — Emergency Department (HOSPITAL_COMMUNITY): Payer: Medicaid Other

## 2014-07-02 ENCOUNTER — Encounter (HOSPITAL_COMMUNITY): Payer: Self-pay

## 2014-07-02 ENCOUNTER — Emergency Department (HOSPITAL_COMMUNITY)
Admission: EM | Admit: 2014-07-02 | Discharge: 2014-07-02 | Disposition: A | Discharge status: Home / Self Care | Payer: Medicaid Other | Attending: Emergency Medicine | Admitting: Emergency Medicine

## 2014-07-02 DIAGNOSIS — F909 Attention-deficit hyperactivity disorder, unspecified type: Secondary | ICD-10-CM | POA: Insufficient documentation

## 2014-07-02 DIAGNOSIS — R0789 Other chest pain: Secondary | ICD-10-CM | POA: Diagnosis not present

## 2014-07-02 DIAGNOSIS — R079 Chest pain, unspecified: Secondary | ICD-10-CM

## 2014-07-02 DIAGNOSIS — F410 Panic disorder [episodic paroxysmal anxiety] without agoraphobia: Secondary | ICD-10-CM | POA: Insufficient documentation

## 2014-07-02 DIAGNOSIS — Z79899 Other long term (current) drug therapy: Secondary | ICD-10-CM | POA: Diagnosis not present

## 2014-07-02 DIAGNOSIS — R0602 Shortness of breath: Secondary | ICD-10-CM

## 2014-07-02 DIAGNOSIS — Z3202 Encounter for pregnancy test, result negative: Secondary | ICD-10-CM | POA: Diagnosis not present

## 2014-07-02 DIAGNOSIS — Z87891 Personal history of nicotine dependence: Secondary | ICD-10-CM | POA: Diagnosis not present

## 2014-07-02 DIAGNOSIS — R531 Weakness: Secondary | ICD-10-CM | POA: Diagnosis not present

## 2014-07-02 HISTORY — DX: Anxiety disorder, unspecified: F41.9

## 2014-07-02 LAB — CBC
HCT: 46.4 % — ABNORMAL HIGH (ref 36.0–46.0)
HEMOGLOBIN: 15 g/dL (ref 12.0–15.0)
MCH: 30.4 pg (ref 26.0–34.0)
MCHC: 32.3 g/dL (ref 30.0–36.0)
MCV: 93.9 fL (ref 78.0–100.0)
Platelets: 286 10*3/uL (ref 150–400)
RBC: 4.94 MIL/uL (ref 3.87–5.11)
RDW: 13.4 % (ref 11.5–15.5)
WBC: 6.5 10*3/uL (ref 4.0–10.5)

## 2014-07-02 LAB — I-STAT TROPONIN, ED: TROPONIN I, POC: 0 ng/mL (ref 0.00–0.08)

## 2014-07-02 LAB — BASIC METABOLIC PANEL
Anion gap: 13 (ref 5–15)
BUN: 11 mg/dL (ref 6–23)
CO2: 28 mmol/L (ref 19–32)
Calcium: 9.8 mg/dL (ref 8.4–10.5)
Chloride: 100 mmol/L (ref 96–112)
Creatinine, Ser: 0.81 mg/dL (ref 0.50–1.10)
GFR calc Af Amer: >90 mL/min (ref >90)
GFR, EST NON AFRICAN AMERICAN: 86 mL/min — AB (ref >90)
Glucose, Bld: 102 mg/dL — ABNORMAL HIGH (ref 70–99)
POTASSIUM: 4.1 mmol/L (ref 3.5–5.1)
Sodium: 141 mmol/L (ref 135–145)

## 2014-07-02 LAB — PREGNANCY, URINE: PREG TEST UR: NEGATIVE — AB

## 2014-07-02 LAB — BRAIN NATRIURETIC PEPTIDE: B Natriuretic Peptide: 9.6 pg/mL (ref 0.0–100.0)

## 2014-07-02 LAB — D-DIMER, QUANTITATIVE (NOT AT ARMC): D DIMER QUANT: 0.54 ug{FEU}/mL — AB (ref 0.00–0.48)

## 2014-07-02 MED ORDER — CYCLOBENZAPRINE HCL 10 MG PO TABS: 10.0000 mg | ORAL_TABLET | Freq: Two times a day (BID) | ORAL | Status: DC | PRN

## 2014-07-02 MED ORDER — IOHEXOL 350 MG/ML SOLN
100.0000 mL | Freq: Once | INTRAVENOUS | Status: AC | PRN
  Administered 2014-07-02: 100 mL via INTRAVENOUS

## 2014-07-02 MED ORDER — OXYCODONE-ACETAMINOPHEN 5-325 MG PO TABS
1.0000 | ORAL_TABLET | Freq: Once | ORAL | Status: AC
  Administered 2014-07-02: 1 via ORAL
  Filled 2014-07-02: qty 1

## 2014-07-02 NOTE — ED Notes (Signed)
Patient is unable to urinate at this time,she just went before we called her back. she will call when she can go.

## 2014-07-02 NOTE — Discharge Instructions (Signed)
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
## 2014-10-12 ENCOUNTER — Encounter (HOSPITAL_COMMUNITY): Payer: Self-pay | Admitting: Emergency Medicine

## 2014-10-12 ENCOUNTER — Emergency Department (HOSPITAL_COMMUNITY)
Admission: EM | Admit: 2014-10-12 | Discharge: 2014-10-12 | Disposition: A | Discharge status: Home / Self Care | Payer: Medicaid Other | Attending: Emergency Medicine | Admitting: Emergency Medicine

## 2014-10-12 ENCOUNTER — Emergency Department (HOSPITAL_COMMUNITY): Payer: Medicaid Other

## 2014-10-12 DIAGNOSIS — Z87891 Personal history of nicotine dependence: Secondary | ICD-10-CM | POA: Diagnosis not present

## 2014-10-12 DIAGNOSIS — Z79899 Other long term (current) drug therapy: Secondary | ICD-10-CM | POA: Insufficient documentation

## 2014-10-12 DIAGNOSIS — F909 Attention-deficit hyperactivity disorder, unspecified type: Secondary | ICD-10-CM | POA: Insufficient documentation

## 2014-10-12 DIAGNOSIS — M79671 Pain in right foot: Secondary | ICD-10-CM

## 2014-10-12 DIAGNOSIS — F410 Panic disorder [episodic paroxysmal anxiety] without agoraphobia: Secondary | ICD-10-CM | POA: Insufficient documentation

## 2014-10-12 DIAGNOSIS — R110 Nausea: Secondary | ICD-10-CM | POA: Insufficient documentation

## 2014-10-12 DIAGNOSIS — R42 Dizziness and giddiness: Secondary | ICD-10-CM

## 2014-10-12 MED ORDER — HYDROCODONE-ACETAMINOPHEN 5-325 MG PO TABS
2.0000 | ORAL_TABLET | Freq: Once | ORAL | Status: AC
  Administered 2014-10-12: 2 via ORAL
  Filled 2014-10-12: qty 2

## 2014-10-12 MED ORDER — MECLIZINE HCL 50 MG PO TABS: 50.0000 mg | ORAL_TABLET | Freq: Two times a day (BID) | ORAL | Status: DC

## 2014-10-12 MED ORDER — PROMETHAZINE HCL 25 MG PO TABS: 25.0000 mg | ORAL_TABLET | Freq: Four times a day (QID) | ORAL | Status: DC | PRN

## 2014-10-12 MED ORDER — MECLIZINE HCL 25 MG PO TABS
25.0000 mg | ORAL_TABLET | Freq: Once | ORAL | Status: AC
  Administered 2014-10-12: 25 mg via ORAL
  Filled 2014-10-12: qty 1

## 2014-10-12 MED ORDER — HYDROCODONE-ACETAMINOPHEN 5-325 MG PO TABS: 1.0000 | ORAL_TABLET | Freq: Four times a day (QID) | ORAL | Status: DC | PRN

## 2014-10-12 NOTE — Discharge Instructions (Signed)
Vertigo Vertigo means you feel like you or your surroundings are moving when they are not. Vertigo can be dangerous if it occurs when you are at work, driving, or performing difficult activities.  CAUSES  Vertigo occurs when there is a conflict of signals sent to your brain from the visual and sensory systems in your body. There are many different causes of vertigo, including:  Infections, especially in the inner ear.  A bad reaction to a drug or misuse of alcohol and medicines.  Withdrawal from drugs or alcohol.  Rapidly changing positions, such as lying down or rolling over in bed.  A migraine headache.  Decreased blood flow to the brain.  Increased pressure in the brain from a head injury, infection, tumor, or bleeding. SYMPTOMS  You may feel as though the world is spinning around or you are falling to the ground. Because your balance is upset, vertigo can cause nausea and vomiting. You may have involuntary eye movements (nystagmus). DIAGNOSIS  Vertigo is usually diagnosed by physical exam. If the cause of your vertigo is unknown, your caregiver may perform imaging tests, such as an MRI scan (magnetic resonance imaging). TREATMENT  Most cases of vertigo resolve on their own, without treatment. Depending on the cause, your caregiver may prescribe certain medicines. If your vertigo is related to body position issues, your caregiver may recommend movements or procedures to correct the problem. In rare cases, if your vertigo is caused by certain inner ear problems, you may need surgery. HOME CARE INSTRUCTIONS   Follow your caregiver's instructions.  Avoid driving.  Avoid operating heavy machinery.  Avoid performing any tasks that would be dangerous to you or others during a vertigo episode.  Tell your caregiver if you notice that certain medicines seem to be causing your vertigo. Some of the medicines used to treat vertigo episodes can actually make them worse in some people. SEEK  IMMEDIATE MEDICAL CARE IF:   Your medicines do not relieve your vertigo or are making it worse.  You develop problems with talking, walking, weakness, or using your arms, hands, or legs.  You develop severe headaches.  Your nausea or vomiting continues or gets worse.  You develop visual changes.  A family member notices behavioral changes.  Your condition gets worse. MAKE SURE YOU:  Understand these instructions.  Will watch your condition.  Will get help right away if you are not doing well or get worse. Document Released: 12/06/2004 Document Revised: 05/21/2011 Document Reviewed: 09/14/2010 [facility] Patient Information 2015 [place], [place]. This information is not intended to replace advice given to you by your health care provider. Make sure you discuss any questions you have with your health care provider. Foot Sprain The muscles and cord like structures which attach muscle to bone (tendons) that surround the feet are made up of units. A foot sprain can occur at the weakest spot in any of these units. This condition is most often caused by injury to or overuse of the foot, as from playing contact sports, or aggravating a previous injury, or from poor conditioning, or obesity. SYMPTOMS  Pain with movement of the foot.  Tenderness and swelling at the injury site.  Loss of strength is present in moderate or severe sprains. THE THREE GRADES OR SEVERITY OF FOOT SPRAIN ARE:  Mild (Grade I): Slightly pulled muscle without tearing of muscle or tendon fibers or loss of strength.  Moderate (Grade II): Tearing of fibers in a muscle, tendon, or at the attachment to bone, with  small decrease in strength.  Severe (Grade III): Rupture of the muscle-tendon-bone attachment, with separation of fibers. Severe sprain requires surgical repair. Often repeating (chronic) sprains are caused by overuse. Sudden (acute) sprains are caused by direct injury or over-use. DIAGNOSIS  Diagnosis of this  condition is usually by your own observation. If problems continue, a caregiver may be required for further evaluation and treatment. X-rays may be required to make sure there are not breaks in the bones (fractures) present. Continued problems may require physical therapy for treatment. PREVENTION  Use strength and conditioning exercises appropriate for your sport.  Warm up properly prior to working out.  Use athletic shoes that are made for the sport you are participating in.  Allow adequate time for healing. Early return to activities makes repeat injury more likely, and can lead to an unstable arthritic foot that can result in prolonged disability. Mild sprains generally heal in 3 to 10 days, with moderate and severe sprains taking 2 to 10 weeks. Your caregiver can help you determine the proper time required for healing. HOME CARE INSTRUCTIONS   Apply ice to the injury for 15-20 minutes, 03-04 times per day. Put the ice in a plastic bag and place a towel between the bag of ice and your skin.  An elastic wrap (like an Ace bandage) may be used to keep swelling down.  Keep foot above the level of the heart, or at least raised on a footstool, when swelling and pain are present.  Try to avoid use other than gentle range of motion while the foot is painful. Do not resume use until instructed by your caregiver. Then begin use gradually, not increasing use to the point of pain. If pain does develop, decrease use and continue the above measures, gradually increasing activities that do not cause discomfort, until you gradually achieve normal use.  Use crutches if and as instructed, and for the length of time instructed.  Keep injured foot and ankle wrapped between treatments.  Massage foot and ankle for comfort and to keep swelling down. Massage from the toes up towards the knee.  Only take over-the-counter or prescription medicines for pain, discomfort, or fever as directed by your  caregiver. SEEK IMMEDIATE MEDICAL CARE IF:   Your pain and swelling increase, or pain is not controlled with medications.  You have loss of feeling in your foot or your foot turns cold or blue.  You develop new, unexplained symptoms, or an increase of the symptoms that brought you to your caregiver. MAKE SURE YOU:   Understand these instructions.  Will watch your condition.  Will get help right away if you are not doing well or get worse. Document Released: 08/18/2001 Document Revised: 05/21/2011 Document Reviewed: 10/16/2007 [facility] Patient Information 2015 [place], [place]. This information is not intended to replace advice given to you by your health care provider. Make sure you discuss any questions you have with your health care provider.

## 2014-10-12 NOTE — ED Notes (Signed)
Pt complaint of worsening right foot swelling/pain for 2 days; hx of multiple foot surgeries.

## 2014-10-12 NOTE — ED Provider Notes (Signed)
CSN: [id number]     Arrival date & time 10/12/14  1528 History  This chart was scribed for non-physician practitioner, Roxy Horseman, PA-C working with Melene Plan, DO, by Jarvis Morgan, ED Scribe. This patient was seen in room WTR8/WTR8 and the patient's care was started at 4:26 PM.     Chief Complaint  Patient presents with  . Foot Pain   The history is provided by the patient. No language interpreter was used.    Lindsey Curry is a 44 y.o. female who presents to the Emergency Department with a chief complaint of constant, gradually worsening, right foot pain onset 2 days. She is complaining of associated swelling in her right foot.  Pt has an extensive history of issues with her right foot and multiple foot surgeries in 2009 and a bone infection. Pt sees Dr. Lajoyce Corners for this issue. She states that she fell off the bed 2 days ago and landed on the foot. Pt has not taken anything for the pain. She denies any bruising or redness to the area.   Pt has a second complaint of pressure in her bilateral ears. She is complaining of associated dizziness and nausea x 2 days. She states she has a h/o vertigo. She denies any otalgia or drainage from ears.  Past Medical History  Diagnosis Date  . Panic attack   . ADHD (attention deficit hyperactivity disorder)   . Anxiety    Past Surgical History  Procedure Laterality Date  . Foot surgery      x 11 surgeries- rods in place femur, tibia/fibia  . Cesarean section    . Breast surgery      breast implants  . Femur fracture surgery Left   . Humerus fracture surgery Right    No family history on file. History  Substance Use Topics  . Smoking status: Former Games developer  . Smokeless tobacco: Never Used  . Alcohol Use: No   OB History    No data available     Review of Systems  HENT: Negative for ear discharge and ear pain.   Gastrointestinal: Positive for nausea.  Musculoskeletal: Positive for myalgias, joint swelling and arthralgias.  Skin:  Negative for color change.  Neurological: Positive for dizziness.      Allergies  Sulfur  Home Medications   Prior to Admission medications   Medication Sig Start Date End Date Taking? Authorizing Provider  ALPRAZolam Prudy Feeler) 1 MG tablet Take 1 mg by mouth 5 (five) times daily.     Historical Provider, MD  cyclobenzaprine (FLEXERIL) 10 MG tablet Take 1 tablet (10 mg total) by mouth 2 (two) times daily as needed for muscle spasms. 07/02/14   Felicie Morn, NP  divalproex (DEPAKOTE) 500 MG DR tablet Take 500 mg by mouth 2 (two) times daily.    Historical Provider, MD  methylphenidate (RITALIN) 20 MG tablet Take 20 mg by mouth 3 (three) times daily.     Historical Provider, MD  PARoxetine (PAXIL) 40 MG tablet Take 40 mg by mouth at bedtime.     Historical Provider, MD  temazepam (RESTORIL) 30 MG capsule Take 60 mg by mouth at bedtime.    Historical Provider, MD   Triage Vitals: BP 112/79 mmHg  Pulse 96  Temp(Src) 97.6 F (36.4 C) (Oral)  Resp 18  Wt 150 lb (68.04 kg)  SpO2 100%  LMP 10/12/2014  Physical Exam  Constitutional: She is oriented to person, place, and time. She appears well-developed and well-nourished. No distress.  HENT:  Head: Normocephalic and atraumatic.  Bilateral TMs are clear  Eyes: Conjunctivae and EOM are normal.  Neck: Neck supple. No tracheal deviation present.  Cardiovascular: Normal rate and intact distal pulses.   Pulmonary/Chest: Effort normal. No respiratory distress.  Musculoskeletal: Normal range of motion.  Range of motion right foot limited secondary to post operative changes and fusion, no focal tenderness, mild swelling, no erythema, no cellulitis, no abscess  Neurological: She is alert and oriented to person, place, and time.  sensation intact CN 3-12 intact  Skin: Skin is warm and dry.  Psychiatric: She has a normal mood and affect. Her behavior is normal.  Nursing note and vitals reviewed.   ED Course  Procedures (including critical care  time)  DIAGNOSTIC STUDIES: Oxygen Saturation is 100% on RA, normal by my interpretation.    COORDINATION OF CARE:    Labs Review Labs Reviewed - No data to display  Imaging Review Dg Foot Complete Right  10/12/2014   CLINICAL DATA:  Patient recently fell.  Object also fell onto foot.  EXAM: RIGHT FOOT COMPLETE - 3+ VIEW  COMPARISON:  Jul 18, 2011  FINDINGS: Frontal, oblique, and lateral views were obtained. There has been previous rod fixation through the tibia extending through the ankle joint and transfixing the calcaneus. There is extensive remodeling in the calcaneus, stable. 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  There is no demonstrable acute fracture or dislocation. There is moderate narrowing of the first MTP joint as well as moderate narrowing of all PIP and DIP joints. Bones are osteoporotic. No erosive change. Resort shin of the distal fibula is present, stable compared to prior study.  IMPRESSION: Postoperative change involving the calcaneus with remodeling. 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 Narrowing of multiple distal joints is stable. No acute fracture or dislocation. Bones are somewhat osteoporotic.   Electronically Signed   By: Bretta Bang III M.D.   On: 10/12/2014 16:13     EKG Interpretation None      MDM   Final diagnoses:  Right foot pain  Vertigo    atient with right foot pain following a fall. Plain films are unremarkable for any new injuries.She has mild swelling of the foot, but there is no evidence of infection, there is no erythema, cellulitis, or excessive heat. Doubt septic jointWill recommend orthopedic follow-up. Patient understands and agrees the plan.  Patient also complains of having some vertigo for the past couple of days. She has this from time to time.  Will give her some meclizine and phenergan for her symptoms.   She is neurovascularly intact. Doubt central vertigo.  I personally performed the services described in this documentation, which was scribed in my presence. The recorded information has been reviewed and is accurate.      Roxy Horseman, PA-C 10/12/14 1651  Melene Plan, DO 10/12/14 2324

## 2015-09-20 ENCOUNTER — Emergency Department (HOSPITAL_COMMUNITY)
Admission: EM | Admit: 2015-09-20 | Discharge: 2015-09-21 | Disposition: A | Discharge status: Home / Self Care | Payer: No Typology Code available for payment source | Attending: Emergency Medicine | Admitting: Emergency Medicine

## 2015-09-20 DIAGNOSIS — Z87891 Personal history of nicotine dependence: Secondary | ICD-10-CM | POA: Insufficient documentation

## 2015-09-20 DIAGNOSIS — F32A Depression, unspecified: Secondary | ICD-10-CM

## 2015-09-20 DIAGNOSIS — Z79899 Other long term (current) drug therapy: Secondary | ICD-10-CM | POA: Insufficient documentation

## 2015-09-20 DIAGNOSIS — R4182 Altered mental status, unspecified: Secondary | ICD-10-CM | POA: Diagnosis present

## 2015-09-20 DIAGNOSIS — F1910 Other psychoactive substance abuse, uncomplicated: Secondary | ICD-10-CM | POA: Diagnosis not present

## 2015-09-20 DIAGNOSIS — F329 Major depressive disorder, single episode, unspecified: Secondary | ICD-10-CM | POA: Diagnosis not present

## 2015-09-20 DIAGNOSIS — F1414 Cocaine abuse with cocaine-induced mood disorder: Secondary | ICD-10-CM | POA: Diagnosis present

## 2015-09-20 LAB — RAPID URINE DRUG SCREEN, HOSP PERFORMED
Amphetamines: POSITIVE — AB
Barbiturates: NOT DETECTED
Benzodiazepines: POSITIVE — AB
Cocaine: POSITIVE — AB
Opiates: NOT DETECTED
Tetrahydrocannabinol: NOT DETECTED

## 2015-09-20 LAB — COMPREHENSIVE METABOLIC PANEL
ALK PHOS: 57 U/L (ref 38–126)
ALT: 33 U/L (ref 14–54)
ANION GAP: 8 (ref 5–15)
AST: 41 U/L (ref 15–41)
Albumin: 4 g/dL (ref 3.5–5.0)
BILIRUBIN TOTAL: 1.1 mg/dL (ref 0.3–1.2)
BUN: 12 mg/dL (ref 6–20)
CO2: 24 mmol/L (ref 22–32)
Calcium: 8.7 mg/dL — ABNORMAL LOW (ref 8.9–10.3)
Chloride: 107 mmol/L (ref 101–111)
Creatinine, Ser: 0.85 mg/dL (ref 0.44–1.00)
GFR calc Af Amer: >60 mL/min (ref >60)
Glucose, Bld: 114 mg/dL — ABNORMAL HIGH (ref 65–99)
POTASSIUM: 3.7 mmol/L (ref 3.5–5.1)
Sodium: 139 mmol/L (ref 135–145)
TOTAL PROTEIN: 6.5 g/dL (ref 6.5–8.1)

## 2015-09-20 LAB — CBC
HEMATOCRIT: 32.2 % — AB (ref 36.0–46.0)
HEMOGLOBIN: 10.4 g/dL — AB (ref 12.0–15.0)
MCH: 28.7 pg (ref 26.0–34.0)
MCHC: 32.3 g/dL (ref 30.0–36.0)
MCV: 88.7 fL (ref 78.0–100.0)
Platelets: 255 10*3/uL (ref 150–400)
RBC: 3.63 MIL/uL — AB (ref 3.87–5.11)
RDW: 15.2 % (ref 11.5–15.5)
WBC: 6.2 10*3/uL (ref 4.0–10.5)

## 2015-09-20 LAB — ACETAMINOPHEN LEVEL: Acetaminophen (Tylenol), Serum: <10 ug/mL — ABNORMAL LOW (ref 10–30)

## 2015-09-20 LAB — ETHANOL: Alcohol, Ethyl (B): <5 mg/dL (ref <5)

## 2015-09-20 LAB — CBG MONITORING, ED: Glucose-Capillary: 115 mg/dL — ABNORMAL HIGH (ref 65–99)

## 2015-09-20 LAB — SALICYLATE LEVEL: Salicylate Lvl: <4 mg/dL (ref 2.8–30.0)

## 2015-09-20 NOTE — ED Notes (Signed)
BIB EMS found sleeping in car, alert and confused to time, situation, and place. Pt has hx of depression and meds were changed yesterday.

## 2015-09-20 NOTE — ED Notes (Signed)
Pt went to restroom and when asked to collect urine pt stated she would, then proceeded to use the bathroom and did not collect sample

## 2015-09-20 NOTE — ED Provider Notes (Signed)
CSN: [id number]     Arrival date & time 09/20/15  1655 History   First MD Initiated Contact with Patient 09/20/15 1732     Chief Complaint  Patient presents with  . Altered Mental Status    Level V caveat due to altered mental status.  Patient is a 45 y.o. female presenting with altered mental status. The history is provided by the patient and the EMS personnel.  Altered Mental Status Associated symptoms: no abdominal pain   Patient was brought in after being found sleeping in a car. Patient is somewhat difficult historian but states that she took the Zoloft. States she had been on Paxil but cannot really that she was nervous about taking it. States she was dealing with the death of her mother and some financial issues. She is somewhat rambling with her speech does mention she was worried about hurting herse she denies substance abuse.  Past Medical History  Diagnosis Date  . Panic attack   . ADHD (attention deficit hyperactivity disorder)   . Anxiety    Past Surgical History  Procedure Laterality Date  . Foot surgery      x 11 surgeries- rods in place femur, tibia/fibia  . Cesarean section    . Breast surgery      breast implants  . Femur fracture surgery Left   . Humerus fracture surgery Right    No family history on file. Social History  Substance Use Topics  . Smoking status: Former Games developer  . Smokeless tobacco: Never Used  . Alcohol Use: No   OB History    No data available     Review of Systems  Unable to perform ROS: Mental status change  Respiratory: Negative for shortness of breath.   Cardiovascular: Negative for chest pain.  Gastrointestinal: Negative for abdominal pain.  Psychiatric/Behavioral: Positive for dysphoric mood.      Allergies  Sulfur  Home Medications   Prior to Admission medications   Medication Sig Start Date End Date Taking? Authorizing Provider  ALPRAZolam Prudy Feeler) 1 MG tablet Take 1 mg by mouth 5 (five) times daily.    Yes Historical  Provider, MD  amphetamine-dextroamphetamine (ADDERALL) 20 MG tablet TK 1/2 T PO QD FOR 7 DAYS AND THEN TK 1 T PO QD UNTIL GONE 09/19/15  Yes Historical Provider, MD  cyclobenzaprine (FLEXERIL) 10 MG tablet Take 1 tablet (10 mg total) by mouth 2 (two) times daily as needed for muscle spasms. 07/02/14  Yes Felicie Morn, NP  gabapentin (NEURONTIN) 300 MG capsule Take 300 mg by mouth 3 (three) times daily.   Yes Historical Provider, MD  methocarbamol (ROBAXIN) 500 MG tablet Take 500 mg by mouth 3 (three) times daily.   Yes Historical Provider, MD  sertraline (ZOLOFT) 100 MG tablet TK 1/2 T PO QD FOR 7 DAYS AND THEN TK 1 T PO QD UNTIL GONE 09/19/15  Yes Historical Provider, MD  temazepam (RESTORIL) 30 MG capsule Take 60 mg by mouth at bedtime.   Yes Historical Provider, MD  HYDROcodone-acetaminophen (NORCO/VICODIN) 5-325 MG per tablet Take 1-2 tablets by mouth every 6 (six) hours as needed. Patient not taking: Reported on 09/20/2015 10/12/14   Roxy Horseman, PA-C  meclizine (ANTIVERT) 50 MG tablet Take 1 tablet (50 mg total) by mouth 2 (two) times daily. Patient not taking: Reported on 09/20/2015 10/12/14   Roxy Horseman, PA-C  promethazine (PHENERGAN) 25 MG tablet Take 1 tablet (25 mg total) by mouth every 6 (six) hours as needed for nausea or  vomiting. Patient not taking: Reported on 09/20/2015 10/12/14   Roxy Horsemanobert Browning, PA-C   BP 127/106 mmHg  Pulse 77  Temp(Src) 98.1 F (36.7 C) (Oral)  Resp 11  SpO2 94% Physical Exam  Constitutional: She appears well-developed.  HENT:  Head: Atraumatic.  Eyes: Pupils are equal, round, and reactive to light.  Neck: Neck supple.  Cardiovascular: Normal rate.   Pulmonary/Chest: Effort normal.  Abdominal: Soft. There is no tenderness.  Neurological:  Alert and oriented 3, but somewhat rambling speech.  Skin: Skin is warm.    ED Course  Procedures (including critical care time) Labs Review Labs Reviewed  COMPREHENSIVE METABOLIC PANEL - Abnormal; Notable for  the following:    Glucose, Bld 114 (*)    Calcium 8.7 (*)    All other components within normal limits  CBC - Abnormal; Notable for the following:    RBC 3.63 (*)    Hemoglobin 10.4 (*)    HCT 32.2 (*)    All other components within normal limits  ACETAMINOPHEN LEVEL - Abnormal; Notable for the following:    Acetaminophen (Tylenol), Serum <10 (*)    All other components within normal limits  URINE RAPID DRUG SCREEN, HOSP PERFORMED - Abnormal; Notable for the following:    Cocaine POSITIVE (*)    Benzodiazepines POSITIVE (*)    Amphetamines POSITIVE (*)    All other components within normal limits  CBG MONITORING, ED - Abnormal; Notable for the following:    Glucose-Capillary 115 (*)    All other components within normal limits  ETHANOL  SALICYLATE LEVEL    Imaging Review No results found. I have personally reviewed and evaluated these images and lab results as part of my medical decision-making.   EKG Interpretation   Date/Time:  Tuesday September 20 2015 18:45:08 EDT Ventricular Rate:  94 PR Interval:    QRS Duration: 118 QT Interval:  356 QTC Calculation: 446 R Axis:   -54 Text Interpretation:  Sinus rhythm Incomplete left bundle branch block  Probable left ventricular hypertrophy Confirmed by Rubin PayorPICKERING  MD, Harrold DonathNATHAN  [phone] on 09/20/2015 7:06:18 PM      MDM   Final diagnoses:  Substance abuse  Depression    Patient presented with altered mental status. Found in her car. Appears to have some substance abuse, but also difficult historian but seemed to be depression. At this point she is somewhat sleepy but appears to medically cleared. To be seen by TTS.    Benjiman CoreNathan Areej Tayler, MD 09/21/15 [phone]

## 2015-09-21 DIAGNOSIS — F1414 Cocaine abuse with cocaine-induced mood disorder: Secondary | ICD-10-CM | POA: Diagnosis not present

## 2015-09-21 MED ORDER — IBUPROFEN 200 MG PO TABS
600.0000 mg | ORAL_TABLET | Freq: Three times a day (TID) | ORAL | Status: DC | PRN
  Administered 2015-09-21: 600 mg via ORAL
  Filled 2015-09-21: qty 3

## 2015-09-21 MED ORDER — LORAZEPAM 1 MG PO TABS
1.0000 mg | ORAL_TABLET | Freq: Three times a day (TID) | ORAL | Status: DC | PRN
  Administered 2015-09-21: 1 mg via ORAL
  Filled 2015-09-21: qty 1

## 2015-09-21 MED ORDER — ACETAMINOPHEN 325 MG PO TABS: 650.0000 mg | ORAL_TABLET | ORAL | Status: DC | PRN

## 2015-09-21 NOTE — Consult Note (Signed)
[place] [facility] Consult   Reason for Consult:  Substance abuse Referring Physician:  EDP Patient Identification: Domitila Stetler MRN:  [id number] Principal Diagnosis: Cocaine abuse with cocaine-induced mood disorder [facility]) Diagnosis:   Patient Active Problem List   Diagnosis Date Noted  . Cocaine abuse with cocaine-induced mood disorder [facility]) [F14.14] 09/21/2015    Priority: High  . Abnormal uterine bleeding [N93.9] 03/23/2011  . DIZZINESS [R42] 07/15/2008  . DYSPNEA [R06.02] 07/15/2008  . FEVER, HX OF [Z91.89] 07/15/2008  . CHEST PAIN, ATYPICAL [R07.89] 07/05/2008  . OBSESSIVE-COMPULSIVE DISORDER [F42.9] 05/31/2008  . DEPRESSION/ANXIETY [F34.1] 05/31/2008  . Other, mixed, or unspecified nondependent drug abuse, unspecified [F19.10] 05/31/2008  . CHRONIC OSTEOMYELITIS ANKLE AND FOOT [X44.818] 05/31/2008  . CELLULITIS, LEG, RIGHT [L02.419, H63.149] 04/16/2008    Total Time spent with patient: 45 minutes  Subjective:   Areen Jonette Wassel is a 45 y.o. female patient does not warrant admission.  HPI:  45 yo female who presented to the ED after being found sleeping in her car by police with confusion.  Today, she is clear and coherent.  Denies suicidal/homicidal ideations, hallucinations, and withdrawal symptoms.  She is a patient of Dr. Toy Care and does not need referrals, does not want substance abuse treatment.  Past Psychiatric History: OCD, depression, anxiety, substance abuse  Risk to Self: Suicidal Ideation: No (denies) Suicidal Intent: No (denies) Is patient at risk for suicide?: No Suicidal Plan?: No (denies) Access to Means: No What has been your use of drugs/alcohol within the last 12 months?: none for 12 yrs per pt How many times?: 0 Triggers for Past Attempts: None known Intentional Self Injurious Behavior: None Risk to Others: Homicidal Ideation: No (denies) Thoughts of Harm to Others: No (denies) Current Homicidal Intent: No Current Homicidal Plan:  No Access to Homicidal Means: No Identified Victim: none noted History of harm to others?: No (denies) Assessment of Violence: None Noted Does patient have access to weapons?: No Criminal Charges Pending?: No Does patient have a court date: No Prior Inpatient Therapy: Prior Inpatient Therapy: No (denies) Prior Therapy Dates: na Prior Therapy Facilty/Provider(s): na Reason for Treatment: na Prior Outpatient Therapy: Prior Outpatient Therapy: Yes Prior Therapy Dates: multiple Prior Therapy Facilty/Provider(s): multiple Reason for Treatment: Depression, Anxiety Does patient have an ACCT team?: No Does patient have Intensive In-House Services?  : No Does patient have [facility] services? : No Does patient have [facility] services?: No  Past Medical History:  Past Medical History  Diagnosis Date  . Panic attack   . ADHD (attention deficit hyperactivity disorder)   . Anxiety     Past Surgical History  Procedure Laterality Date  . Foot surgery      x 11 surgeries- rods in place femur, tibia/fibia  . Cesarean section    . Breast surgery      breast implants  . Femur fracture surgery Left   . Humerus fracture surgery Right    Family History: No family history on file. Family Psychiatric  History: none Social History:  History  Alcohol Use No     History  Drug Use No    Social History   Social History  . Marital Status: Divorced    Spouse Name: N/A  . Number of Children: N/A  . Years of Education: N/A   Social History Main Topics  . Smoking status: Former Research scientist (life sciences)  . Smokeless tobacco: Never Used  . Alcohol Use: No  . Drug Use: No  . Sexual Activity: Yes   Other  Topics Concern  . Not on file   Social History Narrative   Additional Social History:    Allergies:   Allergies  Allergen Reactions  . Sulfur Hives    Labs:  Results for orders placed or performed during the hospital encounter of 09/20/15 (from the past 48 hour(s))  Comprehensive metabolic panel      Status: Abnormal   Collection Time: 09/20/15  5:57 PM  Result Value Ref Range   Sodium 139 135 - 145 mmol/L   Potassium 3.7 3.5 - 5.1 mmol/L   Chloride 107 101 - 111 mmol/L   CO2 24 22 - 32 mmol/L   Glucose, Bld 114 (H) 65 - 99 mg/dL   BUN 12 6 - 20 mg/dL   Creatinine, Ser 0.85 0.44 - 1.00 mg/dL   Calcium 8.7 (L) 8.9 - 10.3 mg/dL   Total Protein 6.5 6.5 - 8.1 g/dL   Albumin 4.0 3.5 - 5.0 g/dL   AST 41 15 - 41 U/L   ALT 33 14 - 54 U/L   Alkaline Phosphatase 57 38 - 126 U/L   Total Bilirubin 1.1 0.3 - 1.2 mg/dL   GFR calc non Af Amer >60 >60 mL/min   GFR calc Af Amer >60 >60 mL/min    Comment: (NOTE) The eGFR has been calculated using the CKD EPI equation. This calculation has not been validated in all clinical situations. eGFR's persistently <60 mL/min signify possible Chronic Kidney Disease.    Anion gap 8 5 - 15  CBC     Status: Abnormal   Collection Time: 09/20/15  5:57 PM  Result Value Ref Range   WBC 6.2 4.0 - 10.5 K/uL   RBC 3.63 (L) 3.87 - 5.11 MIL/uL   Hemoglobin 10.4 (L) 12.0 - 15.0 g/dL   HCT 32.2 (L) 36.0 - 46.0 %   MCV 88.7 78.0 - 100.0 fL   MCH 28.7 26.0 - 34.0 pg   MCHC 32.3 30.0 - 36.0 g/dL   RDW 15.2 11.5 - 15.5 %   Platelets 255 150 - 400 K/uL  Ethanol     Status: None   Collection Time: 09/20/15  5:57 PM  Result Value Ref Range   Alcohol, Ethyl (B) <5 <5 mg/dL    Comment:        LOWEST DETECTABLE LIMIT FOR SERUM ALCOHOL IS 5 mg/dL FOR MEDICAL PURPOSES ONLY   Acetaminophen level     Status: Abnormal   Collection Time: 09/20/15  5:57 PM  Result Value Ref Range   Acetaminophen (Tylenol), Serum <10 (L) 10 - 30 ug/mL    Comment:        THERAPEUTIC CONCENTRATIONS VARY SIGNIFICANTLY. A RANGE OF 10-30 ug/mL MAY BE AN EFFECTIVE CONCENTRATION FOR MANY PATIENTS. HOWEVER, SOME ARE BEST TREATED AT CONCENTRATIONS OUTSIDE THIS RANGE. ACETAMINOPHEN CONCENTRATIONS >150 ug/mL AT 4 HOURS AFTER INGESTION AND >50 ug/mL AT 12 HOURS AFTER INGESTION ARE OFTEN  ASSOCIATED WITH TOXIC REACTIONS.   Salicylate level     Status: None   Collection Time: 09/20/15  5:57 PM  Result Value Ref Range   Salicylate Lvl <5.8 2.8 - 30.0 mg/dL  CBG monitoring, ED     Status: Abnormal   Collection Time: 09/20/15  6:54 PM  Result Value Ref Range   Glucose-Capillary 115 (H) 65 - 99 mg/dL  Urine rapid drug screen (hosp performed)     Status: Abnormal   Collection Time: 09/20/15  9:05 PM  Result Value Ref Range   Opiates NONE DETECTED NONE DETECTED  Cocaine POSITIVE (A) NONE DETECTED   Benzodiazepines POSITIVE (A) NONE DETECTED   Amphetamines POSITIVE (A) NONE DETECTED   Tetrahydrocannabinol NONE DETECTED NONE DETECTED   Barbiturates NONE DETECTED NONE DETECTED    Comment:        DRUG SCREEN FOR MEDICAL PURPOSES ONLY.  IF CONFIRMATION IS NEEDED FOR ANY PURPOSE, NOTIFY LAB WITHIN 5 DAYS.        LOWEST DETECTABLE LIMITS FOR URINE DRUG SCREEN Drug Class       Cutoff (ng/mL) Amphetamine      1000 Barbiturate      200 Benzodiazepine   937 Tricyclics       169 Opiates          300 Cocaine          300 THC              50     Current Facility-Administered Medications  Medication Dose Route Frequency Provider Last Rate Last Dose  . acetaminophen (TYLENOL) tablet 650 mg  650 mg Oral Q4H PRN Davonna Belling, MD      . ibuprofen (ADVIL,MOTRIN) tablet 600 mg  600 mg Oral Q8H PRN Davonna Belling, MD   600 mg at 09/21/15 0208  . LORazepam (ATIVAN) tablet 1 mg  1 mg Oral Q8H PRN Davonna Belling, MD   1 mg at 09/21/15 0208   Current Outpatient Prescriptions  Medication Sig Dispense Refill  . ALPRAZolam (XANAX) 1 MG tablet Take 1 mg by mouth 5 (five) times daily.     Marland Kitchen amphetamine-dextroamphetamine (ADDERALL) 20 MG tablet TK 1/2 T PO QD FOR 7 DAYS AND THEN TK 1 T PO QD UNTIL GONE  0  . cyclobenzaprine (FLEXERIL) 10 MG tablet Take 1 tablet (10 mg total) by mouth 2 (two) times daily as needed for muscle spasms. 20 tablet 0  . gabapentin (NEURONTIN) 300 MG  capsule Take 300 mg by mouth 3 (three) times daily.    . methocarbamol (ROBAXIN) 500 MG tablet Take 500 mg by mouth 3 (three) times daily.    . sertraline (ZOLOFT) 100 MG tablet TK 1/2 T PO QD FOR 7 DAYS AND THEN TK 1 T PO QD UNTIL GONE  12  . temazepam (RESTORIL) 30 MG capsule Take 60 mg by mouth at bedtime.    Marland Kitchen HYDROcodone-acetaminophen (NORCO/VICODIN) 5-325 MG per tablet Take 1-2 tablets by mouth every 6 (six) hours as needed. (Patient not taking: Reported on 09/20/2015) 10 tablet 0  . meclizine (ANTIVERT) 50 MG tablet Take 1 tablet (50 mg total) by mouth 2 (two) times daily. (Patient not taking: Reported on 09/20/2015) 30 tablet 0  . promethazine (PHENERGAN) 25 MG tablet Take 1 tablet (25 mg total) by mouth every 6 (six) hours as needed for nausea or vomiting. (Patient not taking: Reported on 09/20/2015) 12 tablet 0    Musculoskeletal: Strength & Muscle Tone: within normal limits Gait & Station: normal Patient leans: N/A  Psychiatric Specialty Exam: Physical Exam  Constitutional: She is oriented to person, place, and time. She appears well-developed and well-nourished.  HENT:  Head: Normocephalic.  Neck: Normal range of motion.  Respiratory: Effort normal.  Musculoskeletal: Normal range of motion.  Neurological: She is alert and oriented to person, place, and time.  Skin: Skin is warm and dry.  Psychiatric: She has a normal mood and affect. Her speech is normal and behavior is normal. Judgment and thought content normal. Cognition and memory are normal.    Review of Systems  Constitutional: Negative.  HENT: Negative.   Eyes: Negative.   Respiratory: Negative.   Cardiovascular: Negative.   Gastrointestinal: Negative.   Genitourinary: Negative.   Musculoskeletal: Negative.   Skin: Negative.   Neurological: Negative.   Endo/Heme/Allergies: Negative.   Psychiatric/Behavioral: Positive for substance abuse.    Blood pressure 117/72, pulse 84, temperature 97.9 F (36.6 C),  temperature source Oral, resp. rate 18, SpO2 97 %.There is no weight on file to calculate BMI.  General Appearance: Disheveled  Eye Contact:  Good  Speech:  Normal Rate  Volume:  Normal  Mood:  Euthymic  Affect:  Congruent  Thought Process:  Coherent and Descriptions of Associations: Intact  Orientation:  Full (Time, Place, and Person)  Thought Content:  WDL  Suicidal Thoughts:  No  Homicidal Thoughts:  No  Memory:  Immediate;   Good Recent;   Good Remote;   Good  Judgement:  Fair  Insight:  Fair  Psychomotor Activity:  Normal  Concentration:  Concentration: Good and Attention Span: Good  Recall:  Good  Fund of Knowledge:  Fair  Language:  Good  Akathisia:  No  Handed:  Right  AIMS (if indicated):     Assets:  Leisure Time Physical Health Resilience Social Support  ADL's:  Intact  Cognition:  WNL  Sleep:        Treatment Plan Summary: Daily contact with patient to assess and evaluate symptoms and progress in treatment, Medication management and Plan cocaine abuse with cocaine induced mood disorder:  -Crisis stabilization -Medication management:  Medications not continued due to short stay and state of confusion from substance abuse -Individual and substance abuse counseling  Disposition: No evidence of imminent risk to self or others at present.    Waylan Boga, NP 09/21/2015 10:39 AM Patient seen face-to-face for psychiatric evaluation, chart reviewed and case discussed with the physician extender and developed treatment plan. Reviewed the information documented and agree with the treatment plan. Corena Pilgrim, MD

## 2015-09-21 NOTE — B[facility] Suicide Risk Assessment (Signed)
Suicide Risk Assessment  Discharge Assessment   [facility]   Principal Problem: Cocaine abuse with cocaine-induced mood disorder [facility](HCC) Discharge Diagnoses:  Patient Active Problem List   Diagnosis Date Noted  . Cocaine abuse with cocaine-induced mood disorder [facility](HCC) [F14.14] 09/21/2015    Priority: High  . Abnormal uterine bleeding [N93.9] 03/23/2011  . DIZZINESS [R42] 07/15/2008  . DYSPNEA [R06.02] 07/15/2008  . FEVER, HX OF [Z91.89] 07/15/2008  . CHEST PAIN, ATYPICAL [R07.89] 07/05/2008  . OBSESSIVE-COMPULSIVE DISORDER [F42.9] 05/31/2008  . DEPRESSION/ANXIETY [F34.1] 05/31/2008  . Other, mixed, or unspecified nondependent drug abuse, unspecified [F19.10] 05/31/2008  . CHRONIC OSTEOMYELITIS ANKLE AND FOOT [U93.235][M86.679] 05/31/2008  . CELLULITIS, LEG, RIGHT [L02.419, L03.119] 04/16/2008    Total Time spent with patient: 45 minutes    Musculoskeletal: Strength & Muscle Tone: within normal limits Gait & Station: normal Patient leans: N/A  Psychiatric Specialty Exam: Physical Exam  Constitutional: She is oriented to person, place, and time. She appears well-developed and well-nourished.  HENT:  Head: Normocephalic.  Neck: Normal range of motion.  Respiratory: Effort normal.  Musculoskeletal: Normal range of motion.  Neurological: She is alert and oriented to person, place, and time.  Skin: Skin is warm and dry.  Psychiatric: She has a normal mood and affect. Her speech is normal and behavior is normal. Judgment and thought content normal. Cognition and memory are normal.    Review of Systems  Constitutional: Negative.   HENT: Negative.   Eyes: Negative.   Respiratory: Negative.   Cardiovascular: Negative.   Gastrointestinal: Negative.   Genitourinary: Negative.   Musculoskeletal: Negative.   Skin: Negative.   Neurological: Negative.   Endo/Heme/Allergies: Negative.   Psychiatric/Behavioral: Positive for substance abuse.    Blood pressure  117/72, pulse 84, temperature 97.9 F (36.6 C), temperature source Oral, resp. rate 18, SpO2 97 %.There is no weight on file to calculate BMI.  General Appearance: Disheveled  Eye Contact:  Good  Speech:  Normal Rate  Volume:  Normal  Mood:  Euthymic  Affect:  Congruent  Thought Process:  Coherent and Descriptions of Associations: Intact  Orientation:  Full (Time, Place, and Person)  Thought Content:  WDL  Suicidal Thoughts:  No  Homicidal Thoughts:  No  Memory:  Immediate;   Good Recent;   Good Remote;   Good  Judgement:  Fair  Insight:  Fair  Psychomotor Activity:  Normal  Concentration:  Concentration: Good and Attention Span: Good  Recall:  Good  Fund of Knowledge:  Fair  Language:  Good  Akathisia:  No  Handed:  Right  AIMS (if indicated):     Assets:  Leisure Time Physical Health Resilience Social Support  ADL's:  Intact  Cognition:  WNL  Sleep:      Mental Status Per Nursing Assessment::   On Admission:   substance abuse with confusion  Demographic Factors:  Caucasian  Loss Factors: NA  Historical Factors: NA  Risk Reduction Factors:   Sense of responsibility to family, Living with another person, especially a relative, Positive social support and Positive therapeutic relationship  Continued Clinical Symptoms:  None   Cognitive Features That Contribute To Risk:  None    Suicide Risk:  Minimal: No identifiable suicidal ideation.  Patients presenting with no risk factors but with morbid ruminations; may be classified as minimal risk based on the severity of the depressive symptoms    Plan Of Care/Follow-up recommendations:  Activity:  as tolerated  Diet:  heart healthy diet  Nanine Means, NP 09/21/2015, 10:47 AM

## 2015-09-21 NOTE — Discharge Instructions (Signed)
For your ongoing behavioral health needs, you are advised to continue treatment with Milagros Evenerupinder Kaur, MD, your current outpatient psychiatrist:       Milagros Evenerupinder Kaur, MD      [address]      [place], [place] [address]      [phone]

## 2015-09-21 NOTE — ED Notes (Signed)
Patient discharged to home.  All belongings returned and signed for.  Left the unit ambulatory and was escorted to the front lobby.  She denies thoughts of harm to self or others.

## 2015-09-21 NOTE — BH Assessment (Signed)
[facility] Assessment Progress Note  Per Thedore MinsMojeed Akintayo, MD, this pt does not require psychiatric hospitalization at this time.  [facility] with recommendation to continue treatment with Milagros Evenerupinder Kaur, MD, her current outpatient provider.  This has been included in pt's discharge instructions.  Pt's nurse, Dawnaly, has been notified.  Doylene Canninghomas Berlynn Warsame, MA Triage Specialist [phone]

## 2015-09-21 NOTE — ED Notes (Signed)
Pt ambulated to bathroom with steady gait and no assistance needed.

## 2015-09-21 NOTE — ED Notes (Addendum)
Pt Belongings: Shirts, flip-flops, underwear; black planner with papers; black purse, black wallet; black samsung phone
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
## 2015-09-21 NOTE — ED Notes (Signed)
Pt awake and alert; pt denies SI / HI; pt states "I don't need detox"; pt states that she wants her pain medications and for us to treat her chronic pain; pt changed into scrubs and belongings removed from room; pt states "I need my phone and my planner" [facility]
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
## 2015-09-21 NOTE — ED Notes (Signed)
NT and nurse attempted to remove rings off of left ring finger, but the patient's finger was to swollen to remove.  Pt stated that she would remove the rings in the morning when the swelling goes down.

## 2016-07-16 ENCOUNTER — Telehealth (INDEPENDENT_AMBULATORY_CARE_PROVIDER_SITE_OTHER): Payer: Self-pay | Admitting: Orthopedic Surgery

## 2016-07-16 NOTE — Telephone Encounter (Signed)
Ok to print her a paper picture of her femoral rod off canopy.  That should do it . thanks

## 2016-07-16 NOTE — Telephone Encounter (Signed)
Patient state a metal rob was placed in her leg back in 2009/2010 by Dr Ophelia CharterYates or Dr Lajoyce Cornersuda. And she is needing a note/letter to indicate that in order for her to go visit some one in prison due to the metal detectors.

## 2016-07-16 NOTE — Telephone Encounter (Signed)
If patient needs additional documentation we are more than happy to write something for her.

## 2016-07-16 NOTE — Telephone Encounter (Signed)
Ok for note 

## 2016-07-19 NOTE — Telephone Encounter (Signed)
Printed and mailed to patient per her request

## 2020-12-14 ENCOUNTER — Emergency Department (HOSPITAL_COMMUNITY)
Admission: EM | Admit: 2020-12-14 | Discharge: 2020-12-14 | Discharge status: Against Medical Advice | Payer: Medicaid Other

## 2021-04-16 LAB — COLOGUARD: COLOGUARD: NEGATIVE

## 2022-01-31 ENCOUNTER — Emergency Department (HOSPITAL_BASED_OUTPATIENT_CLINIC_OR_DEPARTMENT_OTHER): Payer: Medicaid Other

## 2022-01-31 ENCOUNTER — Other Ambulatory Visit: Payer: Self-pay

## 2022-01-31 ENCOUNTER — Encounter (HOSPITAL_COMMUNITY): Payer: Self-pay

## 2022-01-31 ENCOUNTER — Observation Stay (HOSPITAL_BASED_OUTPATIENT_CLINIC_OR_DEPARTMENT_OTHER)
Admission: EM | Admit: 2022-01-31 | Discharge: 2022-02-01 | Disposition: A | Discharge status: Home / Self Care | Payer: Medicaid Other | Attending: Internal Medicine | Admitting: Internal Medicine

## 2022-01-31 ENCOUNTER — Ambulatory Visit
Admission: EM | Admit: 2022-01-31 | Discharge: 2022-01-31 | Disposition: A | Discharge status: Home / Self Care | Payer: Medicaid Other

## 2022-01-31 ENCOUNTER — Emergency Department (HOSPITAL_BASED_OUTPATIENT_CLINIC_OR_DEPARTMENT_OTHER): Payer: Medicaid Other | Admitting: Radiology

## 2022-01-31 ENCOUNTER — Encounter (HOSPITAL_BASED_OUTPATIENT_CLINIC_OR_DEPARTMENT_OTHER): Payer: Self-pay

## 2022-01-31 DIAGNOSIS — I959 Hypotension, unspecified: Secondary | ICD-10-CM | POA: Diagnosis not present

## 2022-01-31 DIAGNOSIS — F1320 Sedative, hypnotic or anxiolytic dependence, uncomplicated: Secondary | ICD-10-CM

## 2022-01-31 DIAGNOSIS — Z79899 Other long term (current) drug therapy: Secondary | ICD-10-CM

## 2022-01-31 DIAGNOSIS — R8281 Pyuria: Secondary | ICD-10-CM | POA: Diagnosis not present

## 2022-01-31 DIAGNOSIS — G9340 Encephalopathy, unspecified: Secondary | ICD-10-CM | POA: Diagnosis not present

## 2022-01-31 DIAGNOSIS — R4182 Altered mental status, unspecified: Secondary | ICD-10-CM | POA: Diagnosis not present

## 2022-01-31 DIAGNOSIS — Z87891 Personal history of nicotine dependence: Secondary | ICD-10-CM | POA: Diagnosis not present

## 2022-01-31 DIAGNOSIS — F1910 Other psychoactive substance abuse, uncomplicated: Secondary | ICD-10-CM

## 2022-01-31 DIAGNOSIS — Z1152 Encounter for screening for COVID-19: Secondary | ICD-10-CM | POA: Insufficient documentation

## 2022-01-31 DIAGNOSIS — J019 Acute sinusitis, unspecified: Secondary | ICD-10-CM | POA: Insufficient documentation

## 2022-01-31 DIAGNOSIS — R059 Cough, unspecified: Secondary | ICD-10-CM | POA: Diagnosis present

## 2022-01-31 DIAGNOSIS — G9341 Metabolic encephalopathy: Secondary | ICD-10-CM

## 2022-01-31 DIAGNOSIS — R269 Unspecified abnormalities of gait and mobility: Secondary | ICD-10-CM

## 2022-01-31 LAB — CBC WITH DIFFERENTIAL/PLATELET
Abs Immature Granulocytes: 0.03 10*3/uL (ref 0.00–0.07)
Basophils Absolute: 0.1 10*3/uL (ref 0.0–0.1)
Basophils Relative: 1 %
Eosinophils Absolute: 0.1 10*3/uL (ref 0.0–0.5)
Eosinophils Relative: 1 %
HCT: 37.7 % (ref 36.0–46.0)
Hemoglobin: 12 g/dL (ref 12.0–15.0)
Immature Granulocytes: 0 %
Lymphocytes Relative: 12 %
Lymphs Abs: 1.1 10*3/uL (ref 0.7–4.0)
MCH: 30 pg (ref 26.0–34.0)
MCHC: 31.8 g/dL (ref 30.0–36.0)
MCV: 94.3 fL (ref 80.0–100.0)
Monocytes Absolute: 0.8 10*3/uL (ref 0.1–1.0)
Monocytes Relative: 9 %
Neutro Abs: 6.9 10*3/uL (ref 1.7–7.7)
Neutrophils Relative %: 77 %
Platelets: 180 10*3/uL (ref 150–400)
RBC: 4 MIL/uL (ref 3.87–5.11)
RDW: 13 % (ref 11.5–15.5)
WBC: 8.9 10*3/uL (ref 4.0–10.5)
nRBC: 0 % (ref 0.0–0.2)

## 2022-01-31 LAB — PREGNANCY, URINE: Preg Test, Ur: NEGATIVE

## 2022-01-31 LAB — URINALYSIS, ROUTINE W REFLEX MICROSCOPIC
Bilirubin Urine: NEGATIVE
Glucose, UA: NEGATIVE mg/dL
Ketones, ur: NEGATIVE mg/dL
Nitrite: NEGATIVE
Protein, ur: 30 mg/dL — AB
Specific Gravity, Urine: 1.017 (ref 1.005–1.030)
pH: 7 (ref 5.0–8.0)

## 2022-01-31 LAB — COMPREHENSIVE METABOLIC PANEL
ALT: 13 U/L (ref 0–44)
AST: 14 U/L — ABNORMAL LOW (ref 15–41)
Albumin: 4.1 g/dL (ref 3.5–5.0)
Alkaline Phosphatase: 69 U/L (ref 38–126)
Anion gap: 6 (ref 5–15)
BUN: 14 mg/dL (ref 6–20)
CO2: 30 mmol/L (ref 22–32)
Calcium: 9.6 mg/dL (ref 8.9–10.3)
Chloride: 99 mmol/L (ref 98–111)
Creatinine, Ser: 0.88 mg/dL (ref 0.44–1.00)
GFR, Estimated: >60 mL/min (ref >60)
Glucose, Bld: 103 mg/dL — ABNORMAL HIGH (ref 70–99)
Potassium: 4.7 mmol/L (ref 3.5–5.1)
Sodium: 135 mmol/L (ref 135–145)
Total Bilirubin: 0.4 mg/dL (ref 0.3–1.2)
Total Protein: 7.1 g/dL (ref 6.5–8.1)

## 2022-01-31 LAB — RAPID URINE DRUG SCREEN, HOSP PERFORMED
Amphetamines: NOT DETECTED
Barbiturates: NOT DETECTED
Benzodiazepines: POSITIVE — AB
Cocaine: NOT DETECTED
Opiates: NOT DETECTED
Tetrahydrocannabinol: POSITIVE — AB

## 2022-01-31 LAB — ETHANOL: Alcohol, Ethyl (B): <10 mg/dL (ref <10)

## 2022-01-31 LAB — RESP PANEL BY RT-PCR (FLU A&B, COVID) ARPGX2
Influenza A by PCR: NEGATIVE
Influenza B by PCR: NEGATIVE
SARS Coronavirus 2 by RT PCR: NEGATIVE

## 2022-01-31 MED ORDER — AMOXICILLIN-POT CLAVULANATE 875-125 MG PO TABS
1.0000 | ORAL_TABLET | Freq: Two times a day (BID) | ORAL | Status: DC
  Administered 2022-01-31 – 2022-02-01 (×2): 1 via ORAL
  Filled 2022-01-31 (×2): qty 1

## 2022-01-31 MED ORDER — LACTATED RINGERS IV BOLUS
1000.0000 mL | Freq: Once | INTRAVENOUS | Status: AC
  Administered 2022-01-31: 1000 mL via INTRAVENOUS

## 2022-01-31 NOTE — ED Triage Notes (Addendum)
Per spouse: pt has had cough and congestion with general malaise onset 3 days ago; "kinda disoriented" today and sleeping all the time. Pt extremely talkative with flight of ideas. Provider notified.

## 2022-01-31 NOTE — ED Notes (Signed)
Late note- passed pt room while door open - noted pt had removed all monitoring cables (cardiac, pulse ox, bp and nasal cannula) - pt in resting state - aroused pt with verbal stimulation and replaced all cables and placed pt back on 2L O2 via [place]

## 2022-01-31 NOTE — ED Triage Notes (Signed)
Pt c/o "floaters in R eye, then going into L eye." Per visitor: really bad congestion, dizziness, unknown fever, chest hurts from congestion. Reports using magnesium, iron & sudafed "but not all together." Visitor advises recent hospitalization at [facility]. Hx polysubstance abuse

## 2022-01-31 NOTE — ED Notes (Addendum)
Late entry -- Entered room to find pt resting with eyes closed -- arousable with tactile stimulation.  Pt reports upon arousal need to void - assisted to bathroom via w/c by this nurse -- upon returning from bathroom this nurse approached by Dr. Criss Alvine who would like to observe pt gait -- pt ambulated with slow unsteady gait from doorway of room to bed.  Pt reports upon returning to bed dizziness ongoing for 1-2 days.  Pt observed to have frequent wet cough with sinus drainage.  Reportedly had been using Sudafed and Mucinex per ex-spouse who had been at bedside.  Provider then asking to see sudafed bottle which pt states is in her purse -- when attempting to pull sudafed out of pt purse pt pulls bottle of Xanax with several tablets noted - pt first stating she takes for seizures then states she takes for panic attacks.  Provider still at bedside updates pt and pt ex-husband on plan for admission; pt and ex agreeable with plan.  Charge nurse has since been notified of pt with xanax at bedside; will remove from room and store due to xanax being controlled substance

## 2022-01-31 NOTE — ED Provider Notes (Signed)
[facility] EMERGENCY DEPT Provider Note   CSN: [id number] Arrival date & time: 01/31/22  1558     History  Chief Complaint  Patient presents with   Cough    Lindsey Curry is a 51 y.o. female.  HPI 51 year old female presents with confusion and respiratory symptoms.  She has been having cough and congestion and sinus pain/headache for about 3 days or so.  Patient's spouse indicates that today she has seemed confused.  She reports a fever today though this was subjective.  She has been taking Mucinex though not more than recommended and the spouse is concerned that she might have a medication reaction.  She also takes Xanax.  She had what seemed like a heat stroke back in August.  She also had low blood pressure and had to be admitted to an outside hospital.  Urgent care chart shows that she had trouble walking though the spouse says this is from chronic ankle issues.  Home Medications Prior to Admission medications   Medication Sig Start Date End Date Taking? Authorizing Provider  ALPRAZolam Prudy Feeler) 1 MG tablet Take 1 mg by mouth 5 (five) times daily.     [provider]  cyclobenzaprine (FLEXERIL) 10 MG tablet Take 1 tablet (10 mg total) by mouth 2 (two) times daily as needed for muscle spasms. 07/02/14   Felicie Morn, NP  gabapentin (NEURONTIN) 300 MG capsule Take 300 mg by mouth 3 (three) times daily.    [provider]  methocarbamol (ROBAXIN) 500 MG tablet Take 500 mg by mouth 3 (three) times daily.    [provider]      Allergies    Elemental sulfur    Review of Systems   Review of Systems  Constitutional:  Positive for fever.  HENT:  Positive for congestion and sinus pain.   Respiratory:  Positive for cough. Negative for shortness of breath.   Gastrointestinal:  Negative for vomiting.  Neurological:  Positive for headaches.  Psychiatric/Behavioral:  Positive for confusion.     Physical Exam Updated Vital Signs BP  101/82   Pulse 81   Temp 99 F (37.2 C) (Oral)   Resp 18   SpO2 96%  Physical Exam Vitals and nursing note reviewed.  Constitutional:      Appearance: She is well-developed. She is not ill-appearing or diaphoretic.  HENT:     Head: Normocephalic and atraumatic.  Eyes:     Extraocular Movements: Extraocular movements intact.     Pupils: Pupils are equal, round, and reactive to light.  Cardiovascular:     Rate and Rhythm: Normal rate and regular rhythm.     Heart sounds: Normal heart sounds.  Pulmonary:     Effort: Pulmonary effort is normal.     Breath sounds: Normal breath sounds.     Comments: Coarse breath sounds but no wheezing Abdominal:     Palpations: Abdomen is soft.     Tenderness: There is no abdominal tenderness.  Musculoskeletal:     Cervical back: Normal range of motion. No rigidity.  Skin:    General: Skin is warm and dry.  Neurological:     Mental Status: She is alert.     Comments: Patient is awake and alert, no lethargy. She thinks it's Tuesday rather than Wednesday. At first says February, then corrects to November. Unclear if she looked at date on whiteboard in room or remembered. Knows it's 2023 and who the president is.  CN 3-12 grossly intact. 5/5 strength in  all 4 extremities. Grossly normal sensation. Normal finger to nose.      ED Results / Procedures / Treatments   Labs (all labs ordered are listed, but only abnormal results are displayed) Labs Reviewed  COMPREHENSIVE METABOLIC PANEL - Abnormal; Notable for the following components:      Result Value   Glucose, Bld 103 (*)    AST 14 (*)    All other components within normal limits  RAPID URINE DRUG SCREEN, HOSP PERFORMED - Abnormal; Notable for the following components:   Benzodiazepines POSITIVE (*)    Tetrahydrocannabinol POSITIVE (*)    All other components within normal limits  URINALYSIS, ROUTINE W REFLEX MICROSCOPIC - Abnormal; Notable for the following components:   Hgb urine dipstick  TRACE (*)    Protein, ur 30 (*)    Leukocytes,Ua LARGE (*)    Bacteria, UA RARE (*)    Non Squamous Epithelial 0-5 (*)    All other components within normal limits  RESP PANEL BY RT-PCR (FLU A&B, COVID) ARPGX2  CULTURE, BLOOD (ROUTINE X 2)  CULTURE, BLOOD (ROUTINE X 2)  CBC WITH DIFFERENTIAL/PLATELET  ETHANOL  PREGNANCY, URINE    EKG None  Radiology DG Chest 2 View  Result Date: 01/31/2022 CLINICAL DATA:  Cough EXAM: CHEST - 2 VIEW COMPARISON:  Chest x-ray dated July 02, 2014 FINDINGS: Cardiac and mediastinal contours are within normal limits. Mild diffuse hazy opacification of the bilateral lungs on AP view is favored to be due to overlying soft tissues. No focal consolidation. No pleural effusion or pneumothorax. Chronic right humerus fracture with intramedullary rod. IMPRESSION: No acute cardiopulmonary abnormality Electronically Signed   By: Allegra Lai M.D.   On: 01/31/2022 19:06   CT Head Wo Contrast  Result Date: 01/31/2022 CLINICAL DATA:  Mental status change. EXAM: CT HEAD WITHOUT CONTRAST TECHNIQUE: Contiguous axial images were obtained from the base of the skull through the vertex without intravenous contrast. RADIATION DOSE REDUCTION: This exam was performed according to the departmental dose-optimization program which includes automated exposure control, adjustment of the mA and/or kV according to patient size and/or use of iterative reconstruction technique. COMPARISON:  CT head 12/09/2008 FINDINGS: Brain: No evidence of acute infarction, hemorrhage, hydrocephalus, extra-axial collection or mass lesion/mass effect. Vascular: No hyperdense vessel or unexpected calcification. Skull: Normal. Negative for fracture or focal lesion. Sinuses/Orbits: There are air-fluid levels in the maxillary and sphenoid sinuses as well as left frontal sinus. Mastoid air cells are clear. Orbits are within normal limits. Other: None. IMPRESSION: 1. No acute intracranial process. 2. Air-fluid  levels in the paranasal sinuses. Correlate for acute sinusitis. Electronically Signed   By: Darliss Cheney M.D.   On: 01/31/2022 18:52    Procedures Procedures    Medications Ordered in ED Medications  amoxicillin-clavulanate (AUGMENTIN) 875-125 MG per tablet 1 tablet (1 tablet Oral Given 01/31/22 2133)  lactated ringers bolus 1,000 mL (0 mLs Intravenous Stopped 01/31/22 2016)    ED Course/ Medical Decision Making/ A&P                           Medical Decision Making Amount and/or Complexity of Data Reviewed External Data Reviewed: notes. Labs: ordered.    Details: WBC normal.  No AKI or significant electrolyte disturbance. Radiology: ordered and independent interpretation performed.    Details: No pneumonia on chest x-ray, no head bleed on CT.  Risk Prescription drug management. Decision regarding hospitalization.   Patient presents with altered mental  status.  She is mostly mildly confused and does seem a little off balance when walking and I did observe her walking.  Discussing with the significant other at the bedside, he states that he is concerned she actually took all of the Mucinex DM.  He has not been able to find the bottle and she does not have it with her though she is adamant she did not.  However she does seem a little confused.  Does not have any meningismus.  Normal WBC.  I suspect she has sinusitis but then her confusion and difficulty walking are likely from the dextromethorphan and an accidental overdose.  I think she will need admission for observation and likely an MRI given the trouble walking unless that were to clear up.  Otherwise, she was given oral antibiotics for sinusitis.  Discussed with Dr. Margo Aye for admission.        Final Clinical Impression(s) / ED Diagnoses Final diagnoses:  Altered mental status, unspecified altered mental status type  Acute sinusitis, recurrence not specified, unspecified location    Rx / DC Orders ED Discharge Orders      None         Pricilla Loveless, MD 01/31/22 2324

## 2022-01-31 NOTE — ED Provider Notes (Signed)
Patient presents to urgent care with her ex-spouse.  He states that she lives in [place] and he picked her up to bring her to [place] for Thanksgiving with his family.  He states that patient has had a significant cough and congestion and seems to be disoriented today.    History could not be obtained from patient due to patient having flight of ideas, tangential thinking and an overall inability to directly find to questions in any meaningful capacity.    EMR reviewed, PDMP reviewed.  Patient has a history of polypharmacy and polysubstance abuse.  Currently, patient has a prescription for Xanax 1 g 6 times daily which was written by her psychiatrist.  Patient also has tested positive for cocaine and marijuana in the past 6 months.    At this time, patient is unable to ambulate without assistance and has an abnormal, high stepping gait with short stride length and a slightly wide base.  Vital signs are essentially normal on arrival today and patient appears to be in no acute distress.  When asked, patient's ex-spouse admitted that he does not believe that she is mentating well at this time.  He stated that was agreeable to taking her to the emergency room now for further evaluation now.   Theadora Rama Scales, PA-C 01/31/22 1328

## 2022-01-31 NOTE — ED Notes (Signed)
Pt aware of the need for a urine... Unable to currently... 

## 2022-01-31 NOTE — ED Notes (Signed)
Appears to be congestion, dizzy and a cough. Family states that something is off about the Pt. Pt is CAOx4, no numbness/tingling and no obvious signs of a stroke. V/S, as noted. Blood work as noted. Sent from a Urgent Care. Note from there and the triage note.

## 2022-01-31 NOTE — ED Notes (Signed)
Pt observed ambulating independently from hall bathroom - escorted by ED tech

## 2022-01-31 NOTE — Progress Notes (Signed)
Plan of Care Note for accepted transfer   Patient: Lindsey Curry MRN: [id number]   DOA: 01/31/2022  Facility requesting transfer: [facility] Requesting Provider: Dr. Criss Alvine, EDP Reason for transfer: AMS  Facility course: The patient is a 51 year old female with a history of polysubstance abuse, chronic anxiety on home Xanax who presented to [facility] ED with complaints of congestion and hypersomnolence.  She was brought in by her significant other.  For the past 3 to 4 days she has had upper respiratory symptoms.  She was given Sudafed and Mucinex by significant other.  She was noted to be sleepier than normal.  Also significant other could not find her Mucinex bottle.  No reported subjective fevers.    In the ED, afebrile with stable vital signs.  UDS positive for benzodiazepine and THC.  Confused with abnormal gait, per EDP.  She had a noncontrast head CT which revealed air-fluid levels in the paranasal sinuses.  No acute intracranial process.  Correlate for acute sinusitis.  She was started on Augmentin in the ED.  Received 1 L IV fluid bolus LR x 1.  Additionally, mild pyuria was noted on UA.  Plan of care: The patient is accepted for admission to Telemetry medical unit, at [facility] as observation status.  Author: Darlin Drop, DO 01/31/2022  Check [email or web address] for on-call coverage.  Nursing staff, Please call [facility] Admits & Consults System-Wide number on Amion as soon as patient's arrival, so appropriate admitting provider can evaluate the pt.

## 2022-01-31 NOTE — ED Notes (Signed)
Pt placed on 2L O2 via [place] due to O2 desat to 90% RA during resting state- O2 improved to 98% on 2L O2 via [place]

## 2022-02-01 DIAGNOSIS — Z1152 Encounter for screening for COVID-19: Secondary | ICD-10-CM | POA: Diagnosis not present

## 2022-02-01 DIAGNOSIS — G9340 Encephalopathy, unspecified: Secondary | ICD-10-CM | POA: Diagnosis not present

## 2022-02-01 DIAGNOSIS — R059 Cough, unspecified: Secondary | ICD-10-CM | POA: Diagnosis present

## 2022-02-01 DIAGNOSIS — Z87891 Personal history of nicotine dependence: Secondary | ICD-10-CM | POA: Diagnosis not present

## 2022-02-01 DIAGNOSIS — Z79899 Other long term (current) drug therapy: Secondary | ICD-10-CM | POA: Diagnosis not present

## 2022-02-01 DIAGNOSIS — R4182 Altered mental status, unspecified: Secondary | ICD-10-CM

## 2022-02-01 DIAGNOSIS — R8281 Pyuria: Secondary | ICD-10-CM | POA: Insufficient documentation

## 2022-02-01 DIAGNOSIS — J019 Acute sinusitis, unspecified: Secondary | ICD-10-CM | POA: Diagnosis not present

## 2022-02-01 DIAGNOSIS — I959 Hypotension, unspecified: Secondary | ICD-10-CM | POA: Diagnosis not present

## 2022-02-01 DIAGNOSIS — J988 Other specified respiratory disorders: Secondary | ICD-10-CM

## 2022-02-01 LAB — LACTIC ACID, PLASMA: Lactic Acid, Venous: 0.8 mmol/L (ref 0.5–1.9)

## 2022-02-01 LAB — D-DIMER, QUANTITATIVE: D-Dimer, Quant: 0.8 ug/mL-FEU — ABNORMAL HIGH (ref 0.00–0.50)

## 2022-02-01 LAB — HIV ANTIBODY (ROUTINE TESTING W REFLEX): HIV Screen 4th Generation wRfx: NONREACTIVE

## 2022-02-01 MED ORDER — FLUTICASONE PROPIONATE 50 MCG/ACT NA SUSP
2.0000 | Freq: Every day | NASAL | Status: DC
  Administered 2022-02-01: 2 via NASAL
  Filled 2022-02-01: qty 16

## 2022-02-01 MED ORDER — ALBUTEROL SULFATE (2.5 MG/3ML) 0.083% IN NEBU: 2.5000 mg | INHALATION_SOLUTION | Freq: Four times a day (QID) | RESPIRATORY_TRACT | Status: DC | PRN

## 2022-02-01 MED ORDER — DM-GUAIFENESIN ER 30-600 MG PO TB12: 1.0000 | ORAL_TABLET | Freq: Two times a day (BID) | ORAL | 0 refills | Status: AC

## 2022-02-01 MED ORDER — ACETAMINOPHEN 325 MG PO TABS: 650.0000 mg | ORAL_TABLET | Freq: Four times a day (QID) | ORAL | Status: DC | PRN

## 2022-02-01 MED ORDER — DM-GUAIFENESIN ER 30-600 MG PO TB12
1.0000 | ORAL_TABLET | Freq: Two times a day (BID) | ORAL | 0 refills | Status: DC
  Filled 2022-02-01: qty 28, 14d supply, fill #0

## 2022-02-01 MED ORDER — AMOXICILLIN-POT CLAVULANATE 875-125 MG PO TABS
1.0000 | ORAL_TABLET | Freq: Two times a day (BID) | ORAL | 0 refills | Status: DC
  Filled 2022-02-01: qty 28, 14d supply, fill #0

## 2022-02-01 MED ORDER — ALBUTEROL SULFATE HFA 108 (90 BASE) MCG/ACT IN AERS: 1.0000 | INHALATION_SPRAY | Freq: Four times a day (QID) | RESPIRATORY_TRACT | Status: DC | PRN

## 2022-02-01 MED ORDER — SODIUM CHLORIDE 0.9 % IV BOLUS
1000.0000 mL | Freq: Once | INTRAVENOUS | Status: AC
  Administered 2022-02-01: 1000 mL via INTRAVENOUS

## 2022-02-01 MED ORDER — FLUTICASONE PROPIONATE 50 MCG/ACT NA SUSP
2.0000 | Freq: Every day | NASAL | 2 refills | Status: DC
  Filled 2022-02-01: qty 16, fill #0

## 2022-02-01 MED ORDER — ALBUTEROL SULFATE HFA 108 (90 BASE) MCG/ACT IN AERS: 1.0000 | INHALATION_SPRAY | Freq: Four times a day (QID) | RESPIRATORY_TRACT | 0 refills | Status: AC | PRN

## 2022-02-01 MED ORDER — ALBUTEROL SULFATE HFA 108 (90 BASE) MCG/ACT IN AERS
1.0000 | INHALATION_SPRAY | Freq: Four times a day (QID) | RESPIRATORY_TRACT | 0 refills | Status: DC | PRN
  Filled 2022-02-01: qty 1, fill #0

## 2022-02-01 MED ORDER — ACETAMINOPHEN 650 MG RE SUPP: 650.0000 mg | Freq: Four times a day (QID) | RECTAL | Status: DC | PRN

## 2022-02-01 MED ORDER — ENOXAPARIN SODIUM 40 MG/0.4ML IJ SOSY: 40.0000 mg | PREFILLED_SYRINGE | INTRAMUSCULAR | Status: DC

## 2022-02-01 MED ORDER — FLUTICASONE PROPIONATE 50 MCG/ACT NA SUSP: 2.0000 | Freq: Every day | NASAL | 2 refills | Status: AC

## 2022-02-01 MED ORDER — AMOXICILLIN-POT CLAVULANATE 875-125 MG PO TABS: 1.0000 | ORAL_TABLET | Freq: Two times a day (BID) | ORAL | 0 refills | Status: AC

## 2022-02-01 NOTE — ED Notes (Signed)
Pt could be overheard coughing in the room - when nurse entered room pt was found to be trying to exit bed independently however pt high falls risk.  Pt reporting feeling "hot" and linens and gown noted to be saturated with what appears to be sweat (afebrile at this time 97.57F oral)  -- pt assisted to and from hall bathroom by this nurse and ED tech-- pt gown changed and linens changed and pt assisted back into bed.  Continuous cardiac and pulse ox re-established after return from bathroom and pt back on 2L O2 via [place].

## 2022-02-01 NOTE — Progress Notes (Signed)
Pt discharged home with her husband. Home meds returned prior to leaving the floor

## 2022-02-01 NOTE — Plan of Care (Signed)
  Problem: Health Behavior/Discharge Planning: Goal: Ability to manage health-related needs will improve Outcome: Progressing   

## 2022-02-01 NOTE — Discharge Summary (Addendum)
Physician Discharge Summary  Lindsey Curry LOV:[id number] DOB: 08/04/1970 DOA: 01/31/2022  PCP: Oneita Hurt, No  Admit date: 01/31/2022 Discharge date: 02/01/2022 Discharging to: home     Discharge Diagnoses:   Principal Problem:   Acute encephalopathy Active Problems:   Hypotension   Acute sinusitis and respiratory infection   Pyuria     Hospital Course:  This is a 51 year old female with ADHD and anxiety disorder in addition to polysubstance abuse.  The patient presents to the hospital with complaints of sinus pain cough congestion and confusion/hypersomnolence.  The patient apparently took an excess amount of Mucinex DM to self treat an upper respiratory tract infection.  In the ED, she was noted to have a pulse ox of 90%.  UDS was positive for benzodiazepines (she takes Xanax at home) and [facility].   Principal Problem:   Acute encephalopathy -To be secondary to taking too much cough medicine.  The patient already takes Xanax and also was noted to have a UDS that was positive for marijuana. She is on other sedative medications as well including gabapentin, methocarbamol and cyclobenzaprine - Mental status is back to baseline today and she is stable to be discharged  Active Problems:   Hypotension -Resolved after IV fluids given  Acute sinusitis-respiratory infection-vaping --She was on Augmentin  when she was admitted - Also noted to have a cough with chest congestion-she does vape and her vape was found to be in her bed with her-I have counseled her to stop - Have added Flonase and albuterol as needed    Pyuria -No complaints of dysuria or increased frequency          Discharge Instructions  Discharge Instructions     Diet - low sodium heart healthy   Complete by: As directed    Increase activity slowly   Complete by: As directed       Allergies as of 02/01/2022       Reactions   Gabapentin Other (See Comments)   Severe balance issues, made her "mean crazy"    Sulfa Antibiotics Anaphylaxis   Elemental Sulfur Hives        Medication List     STOP taking these medications    cyclobenzaprine 10 MG tablet Commonly known as: FLEXERIL   gabapentin 300 MG capsule Commonly known as: NEURONTIN   methocarbamol 500 MG tablet Commonly known as: ROBAXIN       TAKE these medications    albuterol 108 (90 Base) MCG/ACT inhaler Commonly known as: VENTOLIN HFA Inhale 1-2 puffs into the lungs every 6 (six) hours as needed for wheezing or shortness of breath.   ALPRAZolam 1 MG tablet Commonly known as: XANAX Take 1 mg by mouth 3 (three) times daily.   amoxicillin-clavulanate 875-125 MG tablet Commonly known as: AUGMENTIN Take 1 tablet by mouth every 12 (twelve) hours for 14 days.   dextromethorphan-guaiFENesin 30-600 MG 12hr tablet Commonly known as: MUCINEX DM Take 1 tablet by mouth 2 (two) times daily for 14 days.   fluticasone 50 MCG/ACT nasal spray Commonly known as: FLONASE Place 2 sprays into both nostrils daily.            The results of significant diagnostics from this hospitalization (including imaging, microbiology, ancillary and laboratory) are listed below for reference.    DG Chest 2 View  Result Date: 01/31/2022 CLINICAL DATA:  Cough EXAM: CHEST - 2 VIEW COMPARISON:  Chest x-ray dated July 02, 2014 FINDINGS: Cardiac and mediastinal contours are within normal limits. Mild  diffuse hazy opacification of the bilateral lungs on AP view is favored to be due to overlying soft tissues. No focal consolidation. No pleural effusion or pneumothorax. Chronic right humerus fracture with intramedullary rod. IMPRESSION: No acute cardiopulmonary abnormality Electronically Signed   By: Allegra Lai M.D.   On: 01/31/2022 19:06   CT Head Wo Contrast  Result Date: 01/31/2022 CLINICAL DATA:  Mental status change. EXAM: CT HEAD WITHOUT CONTRAST TECHNIQUE: Contiguous axial images were obtained from the base of the skull through the  vertex without intravenous contrast. RADIATION DOSE REDUCTION: This exam was performed according to the departmental dose-optimization program which includes automated exposure control, adjustment of the mA and/or kV according to patient size and/or use of iterative reconstruction technique. COMPARISON:  CT head 12/09/2008 FINDINGS: Brain: No evidence of acute infarction, hemorrhage, hydrocephalus, extra-axial collection or mass lesion/mass effect. Vascular: No hyperdense vessel or unexpected calcification. Skull: Normal. Negative for fracture or focal lesion. Sinuses/Orbits: There are air-fluid levels in the maxillary and sphenoid sinuses as well as left frontal sinus. Mastoid air cells are clear. Orbits are within normal limits. Other: None. IMPRESSION: 1. No acute intracranial process. 2. Air-fluid levels in the paranasal sinuses. Correlate for acute sinusitis. Electronically Signed   By: Darliss Cheney M.D.   On: 01/31/2022 18:52   Labs:   Basic Metabolic Panel: Recent Labs  Lab 01/31/22 1624  NA 135  K 4.7  CL 99  CO2 30  GLUCOSE 103*  BUN 14  CREATININE 0.88  CALCIUM 9.6     CBC: Recent Labs  Lab 01/31/22 1624  WBC 8.9  NEUTROABS 6.9  HGB 12.0  HCT 37.7  MCV 94.3  PLT 180         SIGNED:   Calvert Cantor, MD  [facility] 02/01/2022, 10:46 AM

## 2022-02-01 NOTE — ED Notes (Signed)
Telephone report to [facility]  

## 2022-02-01 NOTE — H&P (Signed)
History and Physical    Leahmarie Cassity Christian VCB:[id number] DOB: 1970-09-16 DOA: 01/31/2022  PCP: Pcp, No  Patient coming from: Home  Chief Complaint: Confusion  HPI: Robyn Galati is a 51 y.o. female with medical history significant of ADHD, anxiety, panic attacks, polysubstance abuse to the ED with complaints of cough, congestion, sinus pain/headache, and confusion/hypersomnolence.  Patient's significant other was concerned that she took all of her Mucinex DM and he has not been able to find the bottle.  Oxygen saturation 90% on room air, placed on 2 L O2.  Afebrile.  Labs showing no leukocytosis, blood ethanol level undetectable, COVID and flu negative, UDS positive for benzodiazepines and THC.  UA with large amount of leukocytes and microscopy showing 11-20 WBCs and rare bacteria.  Urine pregnancy test negative.  Blood cultures pending.  Chest x-ray negative for acute finding.  CT head negative for acute intracranial process but showing air-fluid levels in the paranasal sinuses concerning for acute sinusitis. Patient was given Augmentin and 1 L LR bolus.  Transferred to [facility].  History limited as patient is somnolent.  She reports feeling ill for the past few days.  She is endorsing cough, congestion, and bilateral frontal/maxillary sinus pain and pressure.  Patient states she was taking Mucinex DM 2 tablets every 12 hours and did not take any extra doses.  She takes Xanax for anxiety and also endorsing marijuana use.  Denies vomiting, abdominal pain, or any urinary symptoms.  No other complaints.  Review of Systems:  Review of Systems  All other systems reviewed and are negative.   Past Medical History:  Diagnosis Date   ADHD (attention deficit hyperactivity disorder)    Anxiety    Panic attack     Past Surgical History:  Procedure Laterality Date   BREAST SURGERY     breast implants   CESAREAN SECTION     FEMUR FRACTURE SURGERY Left    FOOT SURGERY     x  11 surgeries- rods in place femur, tibia/fibia   HUMERUS FRACTURE SURGERY Right      reports that she has quit smoking. She has never used smokeless tobacco. She reports that she does not drink alcohol and does not use drugs.  Allergies  Allergen Reactions   Elemental Sulfur Hives    History reviewed. No pertinent family history.  Prior to Admission medications   Medication Sig Start Date End Date Taking? Authorizing Provider  ALPRAZolam Prudy Feeler) 1 MG tablet Take 1 mg by mouth 5 (five) times daily.     [provider]  cyclobenzaprine (FLEXERIL) 10 MG tablet Take 1 tablet (10 mg total) by mouth 2 (two) times daily as needed for muscle spasms. 07/02/14   Felicie Morn, NP  gabapentin (NEURONTIN) 300 MG capsule Take 300 mg by mouth 3 (three) times daily.    [provider]  methocarbamol (ROBAXIN) 500 MG tablet Take 500 mg by mouth 3 (three) times daily.    [provider]    Physical Exam: Vitals:   02/01/22 0055 02/01/22 0319 02/01/22 0349 02/01/22 0359  BP: (!) 107/55 (!) 86/42 (!) 78/58 (!) 88/58  Pulse: 86 69  65  Resp: 17 16    Temp: (!) 97.4 F (36.3 C) 98 F (36.7 C)    TempSrc: Oral Oral    SpO2: 92% 98%  98%  Weight:  66.9 kg    Height:  5\' 5"  (1.651 m)      Physical Exam Vitals reviewed.  Constitutional:  General: She is not in acute distress. HENT:     Head: Normocephalic and atraumatic.  Eyes:     Extraocular Movements: Extraocular movements intact.  Cardiovascular:     Rate and Rhythm: Normal rate and regular rhythm.     Pulses: Normal pulses.  Pulmonary:     Effort: Pulmonary effort is normal. No respiratory distress.     Breath sounds: Normal breath sounds. No wheezing or rales.  Abdominal:     General: Bowel sounds are normal. There is no distension.     Palpations: Abdomen is soft.     Tenderness: There is no abdominal tenderness.  Musculoskeletal:        General: No swelling or tenderness.     Cervical back: Normal  range of motion.  Skin:    General: Skin is warm and dry.  Neurological:     General: No focal deficit present.     Mental Status: She is alert and oriented to person, place, and time.     Labs on Admission: I have personally reviewed following labs and imaging studies  CBC: Recent Labs  Lab 01/31/22 1624  WBC 8.9  NEUTROABS 6.9  HGB 12.0  HCT 37.7  MCV 94.3  PLT 180   Basic Metabolic Panel: Recent Labs  Lab 01/31/22 1624  NA 135  K 4.7  CL 99  CO2 30  GLUCOSE 103*  BUN 14  CREATININE 0.88  CALCIUM 9.6   GFR: Estimated Creatinine Clearance: 66.5 mL/min (by C-G formula based on SCr of 0.88 mg/dL). Liver Function Tests: Recent Labs  Lab 01/31/22 1624  AST 14*  ALT 13  ALKPHOS 69  BILITOT 0.4  PROT 7.1  ALBUMIN 4.1   No results for input(s): "LIPASE", "AMYLASE" in the last 168 hours. No results for input(s): "AMMONIA" in the last 168 hours. Coagulation Profile: No results for input(s): "INR", "PROTIME" in the last 168 hours. Cardiac Enzymes: No results for input(s): "CKTOTAL", "CKMB", "CKMBINDEX", "TROPONINI" in the last 168 hours. BNP (last 3 results) No results for input(s): "PROBNP" in the last 8760 hours. HbA1C: No results for input(s): "HGBA1C" in the last 72 hours. CBG: No results for input(s): "GLUCAP" in the last 168 hours. Lipid Profile: No results for input(s): "CHOL", "HDL", "LDLCALC", "TRIG", "CHOLHDL", "LDLDIRECT" in the last 72 hours. Thyroid Function Tests: No results for input(s): "TSH", "T4TOTAL", "FREET4", "T3FREE", "THYROIDAB" in the last 72 hours. Anemia Panel: No results for input(s): "VITAMINB12", "FOLATE", "FERRITIN", "TIBC", "IRON", "RETICCTPCT" in the last 72 hours. Urine analysis:    Component Value Date/Time   COLORURINE YELLOW 01/31/2022 1825   APPEARANCEUR CLEAR 01/31/2022 1825   LABSPEC 1.017 01/31/2022 1825   PHURINE 7.0 01/31/2022 1825   GLUCOSEU NEGATIVE 01/31/2022 1825   GLUCOSEU NEG mg/dL 23/55/7322 0254    HGBUR TRACE (A) 01/31/2022 1825   BILIRUBINUR NEGATIVE 01/31/2022 1825   KETONESUR NEGATIVE 01/31/2022 1825   PROTEINUR 30 (A) 01/31/2022 1825   UROBILINOGEN 0.2 05/31/2009 2055   NITRITE NEGATIVE 01/31/2022 1825   LEUKOCYTESUR LARGE (A) 01/31/2022 1825    Radiological Exams on Admission: DG Chest 2 View  Result Date: 01/31/2022 CLINICAL DATA:  Cough EXAM: CHEST - 2 VIEW COMPARISON:  Chest x-ray dated July 02, 2014 FINDINGS: Cardiac and mediastinal contours are within normal limits. Mild diffuse hazy opacification of the bilateral lungs on AP view is favored to be due to overlying soft tissues. No focal consolidation. No pleural effusion or pneumothorax. Chronic right humerus fracture with intramedullary rod. IMPRESSION: No acute  cardiopulmonary abnormality Electronically Signed   By: Allegra Lai M.D.   On: 01/31/2022 19:06   CT Head Wo Contrast  Result Date: 01/31/2022 CLINICAL DATA:  Mental status change. EXAM: CT HEAD WITHOUT CONTRAST TECHNIQUE: Contiguous axial images were obtained from the base of the skull through the vertex without intravenous contrast. RADIATION DOSE REDUCTION: This exam was performed according to the departmental dose-optimization program which includes automated exposure control, adjustment of the mA and/or kV according to patient size and/or use of iterative reconstruction technique. COMPARISON:  CT head 12/09/2008 FINDINGS: Brain: No evidence of acute infarction, hemorrhage, hydrocephalus, extra-axial collection or mass lesion/mass effect. Vascular: No hyperdense vessel or unexpected calcification. Skull: Normal. Negative for fracture or focal lesion. Sinuses/Orbits: There are air-fluid levels in the maxillary and sphenoid sinuses as well as left frontal sinus. Mastoid air cells are clear. Orbits are within normal limits. Other: None. IMPRESSION: 1. No acute intracranial process. 2. Air-fluid levels in the paranasal sinuses. Correlate for acute sinusitis.  Electronically Signed   By: Darliss Cheney M.D.   On: 01/31/2022 18:52    EKG: No EKG done in the ED.  EKG ordered and currently pending.  Assessment and Plan  Acute encephalopathy Polysubstance abuse Acute encephalopathy likely multifactorial in the setting of benzodiazepine and marijuana use.  ?Dextromethorphan overdose.  Patient is currently somnolent but arousable and able to answer questions.  No focal neurodeficit on exam.  CT head negative for acute intracranial process.  No fever or meningeal signs. -Avoid sedating medications and monitor closely  Hypotension Patient received 1 L LR bolus in the ED.  Upon arrival to the hospital blood pressure noted to be low with systolic in the 80s.  No fever, tachycardia, leukocytosis, or signs of sepsis.  Hemoglobin normal and no signs of active bleeding. ?PE given mild hypoxia in the ED and chest x-ray negative for acute finding. -Continue IV fluid resuscitation -Check lactate -Check D-dimer, if elevated, CTA to rule out PE. -Supplemental oxygen as needed  Acute sinusitis CT showing air-fluid levels in the paranasal sinuses.  Patient is endorsing sinus pain/pressure.  No fever or leukocytosis. -Continue Augmentin  Pyuria UA with large amount of leukocytes and microscopy showing 11-20 WBCs and rare bacteria.  Patient is not endorsing any urinary symptoms. -Urine culture  DVT prophylaxis: Lovenox Code Status: Full Code Family Communication: No family available at this time. Level of care: Telemetry bed Admission status: It is my clinical opinion that referral for OBSERVATION is reasonable and necessary in this patient based on the above information provided. The aforementioned taken together are felt to place the patient at high risk for further clinical deterioration. However, it is anticipated that the patient may be medically stable for discharge from the hospital within 24 to 48 hours.   John Giovanni MD [facility]  If  7PM-7AM, please contact night-coverage [email or web address]  02/01/2022, 4:15 AM

## 2022-02-02 ENCOUNTER — Other Ambulatory Visit (HOSPITAL_COMMUNITY): Payer: Self-pay

## 2022-02-06 LAB — CULTURE, BLOOD (ROUTINE X 2)
Culture: NO GROWTH
Culture: NO GROWTH
Special Requests: ADEQUATE
Special Requests: ADEQUATE

## 2022-06-28 ENCOUNTER — Ambulatory Visit (INDEPENDENT_AMBULATORY_CARE_PROVIDER_SITE_OTHER): Payer: Medicaid Other | Admitting: Podiatry

## 2022-06-28 ENCOUNTER — Encounter: Payer: Self-pay | Admitting: Podiatry

## 2022-06-28 DIAGNOSIS — B351 Tinea unguium: Secondary | ICD-10-CM

## 2022-06-28 MED ORDER — CICLOPIROX 8 % EX SOLN: Freq: Every day | CUTANEOUS | 0 refills | Status: DC

## 2022-06-28 NOTE — Addendum Note (Signed)
Addended by: Hedy Jacob on: 06/28/2022 03:50 PM   Modules accepted: Orders
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
## 2022-07-02 ENCOUNTER — Other Ambulatory Visit: Payer: Self-pay | Admitting: *Deleted

## 2022-07-02 ENCOUNTER — Telehealth: Payer: Self-pay | Admitting: *Deleted

## 2022-07-02 ENCOUNTER — Telehealth: Payer: Self-pay | Admitting: Podiatry

## 2022-07-02 ENCOUNTER — Other Ambulatory Visit: Payer: Self-pay | Admitting: Podiatry

## 2022-07-02 MED ORDER — CICLOPIROX 8 % EX SOLN: Freq: Every day | CUTANEOUS | 0 refills | Status: DC

## 2022-07-02 NOTE — Telephone Encounter (Signed)
Pt called and [facility] and she uses the [facility] in [place]. I have taken the old pharmacy's out of her chart.  If you could please send to new pharmacy.   She also asked for the results from the samples that was taken and I told her that usually takes a few weeks and she should hear from the office when they are in, but if she did not hear back in a month to please call us back.

## 2022-07-02 NOTE — Telephone Encounter (Signed)
Called [facility] and cancelled the prescription for the cicloporox-8% solution.

## 2022-07-02 NOTE — Telephone Encounter (Signed)
error 

## 2022-07-02 NOTE — Telephone Encounter (Signed)
Notified pt and she said thank you for calling her.

## 2022-07-03 ENCOUNTER — Telehealth: Payer: Self-pay | Admitting: *Deleted

## 2022-07-03 NOTE — Telephone Encounter (Signed)
[facility] in [place] is requesting office notes from last visit to their office at: [phone] faxed.

## 2022-07-09 ENCOUNTER — Ambulatory Visit (INDEPENDENT_AMBULATORY_CARE_PROVIDER_SITE_OTHER): Payer: Medicaid Other | Admitting: Licensed Clinical Social Worker

## 2022-07-09 ENCOUNTER — Encounter (HOSPITAL_COMMUNITY): Payer: Self-pay

## 2022-07-09 DIAGNOSIS — Z91410 Personal history of adult physical and sexual abuse: Secondary | ICD-10-CM

## 2022-07-09 DIAGNOSIS — F331 Major depressive disorder, recurrent, moderate: Secondary | ICD-10-CM | POA: Diagnosis not present

## 2022-07-09 MED ORDER — CICLOPIROX 8 % EX SOLN: Freq: Every day | CUTANEOUS | 0 refills | Status: AC
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
## 2022-07-20 ENCOUNTER — Ambulatory Visit (INDEPENDENT_AMBULATORY_CARE_PROVIDER_SITE_OTHER): Payer: Medicaid Other | Admitting: Licensed Clinical Social Worker

## 2022-07-20 DIAGNOSIS — Z91410 Personal history of adult physical and sexual abuse: Secondary | ICD-10-CM | POA: Diagnosis not present

## 2022-07-20 DIAGNOSIS — F331 Major depressive disorder, recurrent, moderate: Secondary | ICD-10-CM | POA: Diagnosis not present
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
## 2022-07-25 ENCOUNTER — Ambulatory Visit (INDEPENDENT_AMBULATORY_CARE_PROVIDER_SITE_OTHER): Payer: Medicaid Other | Admitting: Licensed Clinical Social Worker

## 2022-07-25 DIAGNOSIS — F331 Major depressive disorder, recurrent, moderate: Secondary | ICD-10-CM | POA: Diagnosis not present

## 2022-07-25 DIAGNOSIS — Z91410 Personal history of adult physical and sexual abuse: Secondary | ICD-10-CM | POA: Diagnosis not present
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
## 2022-07-26 ENCOUNTER — Ambulatory Visit (INDEPENDENT_AMBULATORY_CARE_PROVIDER_SITE_OTHER): Payer: Medicaid Other | Admitting: Podiatry

## 2022-07-26 ENCOUNTER — Encounter: Payer: Self-pay | Admitting: Podiatry

## 2022-07-26 DIAGNOSIS — B351 Tinea unguium: Secondary | ICD-10-CM | POA: Diagnosis not present

## 2022-07-26 DIAGNOSIS — L603 Nail dystrophy: Secondary | ICD-10-CM
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
## 2022-08-02 ENCOUNTER — Ambulatory Visit (INDEPENDENT_AMBULATORY_CARE_PROVIDER_SITE_OTHER): Payer: Medicaid Other | Admitting: Licensed Clinical Social Worker

## 2022-08-02 DIAGNOSIS — Z91410 Personal history of adult physical and sexual abuse: Secondary | ICD-10-CM | POA: Diagnosis not present

## 2022-08-02 DIAGNOSIS — F331 Major depressive disorder, recurrent, moderate: Secondary | ICD-10-CM | POA: Diagnosis not present
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
## 2022-08-09 ENCOUNTER — Ambulatory Visit (INDEPENDENT_AMBULATORY_CARE_PROVIDER_SITE_OTHER): Payer: Medicaid Other | Admitting: Licensed Clinical Social Worker

## 2022-08-09 DIAGNOSIS — Z91410 Personal history of adult physical and sexual abuse: Secondary | ICD-10-CM

## 2022-08-09 DIAGNOSIS — F331 Major depressive disorder, recurrent, moderate: Secondary | ICD-10-CM
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
## 2022-08-23 ENCOUNTER — Ambulatory Visit (INDEPENDENT_AMBULATORY_CARE_PROVIDER_SITE_OTHER): Payer: Medicaid Other | Admitting: Licensed Clinical Social Worker

## 2022-08-23 DIAGNOSIS — F331 Major depressive disorder, recurrent, moderate: Secondary | ICD-10-CM

## 2022-08-23 DIAGNOSIS — Z91410 Personal history of adult physical and sexual abuse: Secondary | ICD-10-CM | POA: Diagnosis not present
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
